# Patient Record
Sex: Female | Born: 1995 | Race: Black or African American | Hispanic: No | Marital: Single | Smoking: Never smoker
Health system: Southern US, Community
[De-identification: ages and names within clinical notes are randomized; demographics above are authoritative.]

## PROBLEM LIST (undated history)

## (undated) DIAGNOSIS — Z72820 Sleep deprivation: Secondary | ICD-10-CM

## (undated) DIAGNOSIS — R51 Headache: Secondary | ICD-10-CM

## (undated) DIAGNOSIS — N920 Excessive and frequent menstruation with regular cycle: Secondary | ICD-10-CM

## (undated) DIAGNOSIS — R519 Headache, unspecified: Secondary | ICD-10-CM

## (undated) HISTORY — DX: Headache: R51

## (undated) HISTORY — PX: BUNIONECTOMY: SHX129

## (undated) HISTORY — DX: Sleep deprivation: Z72.820

## (undated) HISTORY — DX: Excessive and frequent menstruation with regular cycle: N92.0

## (undated) HISTORY — DX: Headache, unspecified: R51.9

---
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
## 2017-03-07 ENCOUNTER — Ambulatory Visit: Admitting: Family Medicine

## 2017-03-10 ENCOUNTER — Encounter: Payer: Self-pay | Admitting: Family Medicine

## 2017-03-10 ENCOUNTER — Ambulatory Visit (INDEPENDENT_AMBULATORY_CARE_PROVIDER_SITE_OTHER): Admitting: Family Medicine

## 2017-03-10 ENCOUNTER — Other Ambulatory Visit: Payer: Self-pay

## 2017-03-10 VITALS — BP 102/62 | HR 72 | Temp 98.5°F | Ht 64.0 in | Wt 158.0 lb

## 2017-03-10 DIAGNOSIS — M2012 Hallux valgus (acquired), left foot: Secondary | ICD-10-CM | POA: Diagnosis not present

## 2017-03-10 NOTE — Progress Notes (Signed)
Subjective:     Patient ID: Kiara Mcfarland, female   DOB: 13-Nov-1995, 21 y.o.   MRN: [id number]  Foot Pain  Chronicity: left foot bump and pain for about 3 months. Pain is triggered by certain shoes. Episode onset: 3 months. The problem has been unchanged. Associated symptoms comments: At times she some redness in the area but mostly not swollen or red. Exacerbated by: Certain shoes. She has tried nothing for the symptoms.   Current Outpatient Medications on File Prior to Visit  Medication Sig Dispense Refill  . ibuprofen (ADVIL,MOTRIN) 400 MG tablet take 1 tablet by mouth every 8 hours if needed 90 tablet 1  . MONO-LINYAH 0.25-35 MG-MCG tablet take 1 tablet by mouth once daily 84 tablet 3   No current facility-administered medications on file prior to visit.    Past Medical History:  Diagnosis Date  . Headache   . Menorrhagia 01/09/2012  . Sleep deprivation 02/17/2014   Vitals:   03/10/17 0835  BP: 102/62  Pulse: 72  Temp: 98.5 F (36.9 C)  TempSrc: Oral  SpO2: 99%  Weight: 158 lb (71.7 kg)  Height: 5\' 4"  (1.626 m)     Review of Systems     Objective:   Physical Exam  Constitutional: She appears well-developed. No distress.  Cardiovascular: Normal rate, regular rhythm and normal heart sounds.  No murmur heard. Pulmonary/Chest: Effort normal and breath sounds normal. No respiratory distress. She has no wheezes.  Musculoskeletal:       Feet:  Nursing note and vitals reviewed.        Assessment:     Hallux Valgus    Plan:     Check problem list.

## 2017-03-10 NOTE — Assessment & Plan Note (Addendum)
Moderate bunion of her left foot with mild on the right. I discussed conservative measures vs surgical treatment. Mom concern this will worsen over time and will eventually need surgery. They both opted out for surgical treatment. I referred them to the podiatrist. Will defer xray to podiatrist. Ibuprofen as needed for pain. F/U as needed.

## 2017-03-10 NOTE — Patient Instructions (Signed)

## 2017-04-17 ENCOUNTER — Ambulatory Visit (INDEPENDENT_AMBULATORY_CARE_PROVIDER_SITE_OTHER): Admitting: Podiatry

## 2017-04-17 ENCOUNTER — Ambulatory Visit (INDEPENDENT_AMBULATORY_CARE_PROVIDER_SITE_OTHER)

## 2017-04-17 ENCOUNTER — Encounter: Payer: Self-pay | Admitting: Podiatry

## 2017-04-17 DIAGNOSIS — M2012 Hallux valgus (acquired), left foot: Secondary | ICD-10-CM

## 2017-04-17 NOTE — Progress Notes (Signed)
   Subjective:    Patient ID: Diona FoleyJakera M Aiken, female    DOB: Jun 07, 1995, 22 y.o.   MRN: [id number]  HPI    Review of Systems  All other systems reviewed and are negative.      Objective:   Physical Exam        Assessment & Plan:

## 2017-04-19 NOTE — Progress Notes (Signed)
   Subjective: 22 year old female presenting today as a new patient with a chief complaint of pain to the medial side of the left foot that began several months ago. She reports associated swelling of the area secondary to a bunion. She has not done anything to treat the symptoms. Walking or standing for long periods of time increase the pain. There are no alleviating factors noted. Patient is here for further evaluation and treatment.    Past Medical History:  Diagnosis Date  . Headache   . Menorrhagia 01/09/2012  . Sleep deprivation 02/17/2014      Objective: Physical Exam General: The patient is alert and oriented x3 in no acute distress.  Dermatology: Skin is cool, dry and supple bilateral lower extremities. Negative for open lesions or macerations.  Vascular: Palpable pedal pulses bilaterally. No edema or erythema noted. Capillary refill within normal limits.  Neurological: Epicritic and protective threshold grossly intact bilaterally.   Musculoskeletal Exam: Clinical evidence of bunion deformity noted to the respective foot. There is a moderate pain on palpation range of motion of the first MPJ. Lateral deviation of the hallux noted consistent with hallux abductovalgus.  Radiographic Exam: Increased intermetatarsal angle greater than 15 with a hallux abductus angle greater than 30 noted on AP view. Moderate degenerative changes noted within the first MPJ.  Assessment: 1. HAV w/ bunion deformity left foot   Plan of Care:  1. Patient was evaluated. X-Rays reviewed. 2. Today we discussed the conservative versus surgical management of the presenting pathology. The patient opts for surgical management. All possible complications and details of the procedure were explained. All patient questions were answered. No guarantees were expressed or implied. 3. Authorization for surgery was initiated today. Surgery will consist of lapidus bunionectomy left. 4. Return to clinic 1 week  post op.    Felecia ShellingBrent M. Leiby Pigeon, DPM [facility]  Dr. Felecia ShellingBrent M. Dezirea Mccollister, DPM    [address]                                        [place], [place] [id number]                Office [phone]  Fax [phone]

## 2017-04-20 ENCOUNTER — Telehealth: Payer: Self-pay | Admitting: *Deleted

## 2017-04-20 NOTE — Telephone Encounter (Signed)
"  My name is Kiara Mcfarland.  I was calling in regards to my daughter, Kiara Mcfarland.  I want to give you the date that hopefull is available for her to have her surgery.  Please give me a call back."

## 2017-04-25 NOTE — Telephone Encounter (Signed)
I attempted to return her call.  I left her a message to call me back. 

## 2017-04-26 ENCOUNTER — Telehealth: Payer: Self-pay | Admitting: Podiatry

## 2017-04-27 NOTE — Telephone Encounter (Signed)
Called and spoke to pt's mother Annice PihJackie. I told her we would get her daughter scheduled for surgery on 09 May and I would go ahead and contact the surgical center to let them know. I told her she could go ahead and pre-register with the surgical center or wait on them to call which ever was easiest for her. I also informed her that the surgical center would call them between 24 to 48 hours before her scheduled surgery date to let them know the time to arrive for surgery. Pt's mom asked if the surgery was scheduled for 09 May and I replied it was. I told her to call back with any questions or concerns between now and then. I also apologized for just now calling back.

## 2017-04-27 NOTE — Telephone Encounter (Signed)
This is Kiara Mcfarland and I'm calling for my daughter. I'm trying to get her surgery date set and we are preferring it to be performed on 03 Aug 2017. If you could please call me back at [phone]. Thank you.

## 2017-05-08 ENCOUNTER — Other Ambulatory Visit: Payer: Self-pay

## 2017-05-08 MED ORDER — NORGESTIMATE-ETH ESTRADIOL 0.25-35 MG-MCG PO TABS: 1.0000 | ORAL_TABLET | Freq: Every day | ORAL | 11 refills | Status: DC

## 2017-05-08 NOTE — Telephone Encounter (Signed)
Mother left message on nurse line asking for refill on birth control pills. Ples SpecterAlisa Brake, RN [facility] RN)

## 2017-05-21 ENCOUNTER — Ambulatory Visit (HOSPITAL_COMMUNITY): Admission: EM | Admit: 2017-05-21 | Discharge: 2017-05-21 | Discharge status: Against Medical Advice

## 2017-05-21 NOTE — ED Notes (Signed)
Called several times by pt access without answer

## 2017-05-23 ENCOUNTER — Other Ambulatory Visit: Payer: Self-pay

## 2017-05-23 ENCOUNTER — Ambulatory Visit (INDEPENDENT_AMBULATORY_CARE_PROVIDER_SITE_OTHER): Admitting: Internal Medicine

## 2017-05-23 ENCOUNTER — Encounter: Payer: Self-pay | Admitting: Internal Medicine

## 2017-05-23 VITALS — BP 110/70 | HR 60 | Temp 98.4°F | Wt 153.0 lb

## 2017-05-23 DIAGNOSIS — B373 Candidiasis of vulva and vagina: Secondary | ICD-10-CM

## 2017-05-23 DIAGNOSIS — B3731 Acute candidiasis of vulva and vagina: Secondary | ICD-10-CM

## 2017-05-23 LAB — POCT WET PREP (WET MOUNT)
CLUE CELLS WET PREP WHIFF POC: NEGATIVE
TRICHOMONAS WET PREP HPF POC: ABSENT

## 2017-05-23 MED ORDER — FLUCONAZOLE 150 MG PO TABS: ORAL_TABLET | ORAL | 0 refills | Status: DC

## 2017-05-23 NOTE — Patient Instructions (Addendum)
It was so nice to meet you!  You have a yeast infection. I have sent in a prescription for Diflucan. You should take 1 tablet now, which should clear up the yeast infection. If you are still having symptoms in 3 days, you can take the 2nd tablet.  -Dr. Nancy MarusMayo

## 2017-05-25 DIAGNOSIS — B3731 Acute candidiasis of vulva and vagina: Secondary | ICD-10-CM | POA: Insufficient documentation

## 2017-05-25 DIAGNOSIS — B373 Candidiasis of vulva and vagina: Secondary | ICD-10-CM | POA: Insufficient documentation

## 2017-05-25 NOTE — Progress Notes (Signed)
   [facility] Phone: [phone]  Subjective:  Kiara Mcfarland is a 22 year old female presenting to clinic with yellow/green discharge for the last couple of days. She is also having vaginal itching and irritation. She denies any vaginal lesions, pelvic pain, and dysuria. She is intermittently sexually active. She had STD testing a few weeks ago at college and everything was negative. She has not been sexually active since then. She uses OCPs for birth control. No recent antibiotics.  ROS: See HPI for pertinent positives and negatives  Past Medical History- migraines  Family history reviewed for today's visit. No changes.  Social history- patient is a never smoker  Objective: BP 110/70   Pulse 60   Temp 98.4 F (36.9 C) (Oral)   Wt 153 lb (69.4 kg)   LMP 05/23/2017   BMI 26.26 kg/m  Gen: NAD, alert, cooperative with exam GI: +BS, soft, non-tender, non-distended  Assessment/Plan: Vaginal Candidiasis: Wet prep with many yeast. Recent negative STD testing and has not been sexually active since then. - Treat with Diflucan 150mg  x 1, can take 2nd tablet in 3 days if symptoms persist   Willadean CarolKaty Shiah Berhow, MD PGY-3

## 2017-05-25 NOTE — Assessment & Plan Note (Signed)
Wet prep with many yeast. Recent negative STD testing and has not been sexually active since then. - Treat with Diflucan 150mg  x 1, can take 2nd tablet in 3 days if symptoms persist

## 2017-06-12 ENCOUNTER — Encounter (HOSPITAL_COMMUNITY): Payer: Self-pay | Admitting: Family Medicine

## 2017-06-12 ENCOUNTER — Ambulatory Visit (HOSPITAL_COMMUNITY)
Admission: EM | Admit: 2017-06-12 | Discharge: 2017-06-12 | Disposition: A | Discharge status: Home / Self Care | Attending: Internal Medicine | Admitting: Internal Medicine

## 2017-06-12 DIAGNOSIS — J029 Acute pharyngitis, unspecified: Secondary | ICD-10-CM | POA: Diagnosis not present

## 2017-06-12 DIAGNOSIS — J351 Hypertrophy of tonsils: Secondary | ICD-10-CM

## 2017-06-12 DIAGNOSIS — R590 Localized enlarged lymph nodes: Secondary | ICD-10-CM | POA: Diagnosis not present

## 2017-06-12 LAB — POCT RAPID STREP A: STREPTOCOCCUS, GROUP A SCREEN (DIRECT): NEGATIVE

## 2017-06-12 MED ORDER — CLINDAMYCIN HCL 300 MG PO CAPS: 300.0000 mg | ORAL_CAPSULE | Freq: Three times a day (TID) | ORAL | 0 refills | Status: DC

## 2017-06-12 NOTE — ED Triage Notes (Signed)
Pt here for 2 days of sore throat. Denies fever.

## 2017-06-12 NOTE — Discharge Instructions (Signed)
If symptoms do not improve and you develop fever, can't drink fluids, have worsening pain, difficulty breathing, do not wait more than 2 days after starting this antibiotic to come back. You may take 500mg  Tylenol with ibuprofen 600mg  every 6 hours for pain and inflammation.

## 2017-06-12 NOTE — ED Provider Notes (Signed)
  MRN: [id number] DOB: 1995/05/20  Subjective:   Kiara Mcfarland is a 22 y.o. female presenting for 2 day history of worsening sore throat, muffled voice, difficulty swallowing. Denies fever, sinus pain, congestion, ear pain, ear drainage, cough, chest pain, shob, n/v, abdominal pain, rashes. Denies smoking cigarettes.  No current facility-administered medications for this encounter.   Current Outpatient Medications:  .  clindamycin (CLEOCIN) 300 MG capsule, Take 1 capsule (300 mg total) by mouth 3 (three) times daily., Disp: 30 capsule, Rfl: 0 .  ibuprofen (ADVIL,MOTRIN) 400 MG tablet, take 1 tablet by mouth every 8 hours if needed, Disp: 90 tablet, Rfl: 1 .  norgestimate-ethinyl estradiol (MONO-LINYAH) 0.25-35 MG-MCG tablet, Take 1 tablet by mouth daily., Disp: 84 tablet, Rfl: 11   Kiara Mcfarland has No Known Allergies.  Kiara Mcfarland  has a past medical history of Headache, Menorrhagia (01/09/2012), and Sleep deprivation (02/17/2014). Also  has no past surgical history on file.  Objective:   Vitals: BP 130/71   Pulse (!) 102   Temp 99.2 F (37.3 C)   Resp 18   LMP 05/23/2017   SpO2 100%   Physical Exam  Constitutional: She is oriented to person, place, and time. She appears well-developed and well-nourished.  HENT:  Mouth/Throat: Oropharyngeal exudate (with tonsillar erythema and swelling) and posterior oropharyngeal edema present.    Cardiovascular: Normal rate, regular rhythm and intact distal pulses. Exam reveals no gallop and no friction rub.  No murmur heard. Pulmonary/Chest: No respiratory distress. She has no wheezes. She has no rales.  Lymphadenopathy:    She has cervical adenopathy (bilateral).  Neurological: She is alert and oriented to person, place, and time.   Results for orders placed or performed during the hospital encounter of 06/12/17 (from the past 24 hour(s))  POCT rapid strep A [facility])     Status: None   Collection Time: 06/12/17  2:03 PM  Result Value  Ref Range   Streptococcus, Group A Screen (Direct) NEGATIVE NEGATIVE   Assessment and Plan :   Acute pharyngitis, unspecified etiology  Sore throat  Swollen tonsil  Cervical lymphadenopathy  Will start clindamycin 300mg  TID. Counseled patient on potential for adverse effects with medications prescribed today, patient verbalized understanding. Return-to-clinic precautions discussed, patient verbalized understanding.    Wallis BambergMani, Tessia Kassin, [place] 06/12/17 2214

## 2017-06-15 LAB — CULTURE, GROUP A STREP (THRC)

## 2017-06-30 ENCOUNTER — Telehealth: Payer: Self-pay | Admitting: *Deleted

## 2017-06-30 NOTE — Telephone Encounter (Signed)
I don't have the paperwork for this patient yet.

## 2017-06-30 NOTE — Telephone Encounter (Signed)
"  I'm scheduled for surgery on May 5.  I want to reschedule it to May 16."  That date is available, I will get it rescheduled.  "What time?"  Someone from the surgical center will call you a day or two prior to your surgery date and they will give you your arrival time.  I called Aram BeechamCynthia at the surgical center and rescheduled the surgery from 08/03/2017 to 08/10/2017.

## 2017-07-04 ENCOUNTER — Telehealth: Payer: Self-pay | Admitting: *Deleted

## 2017-07-04 NOTE — Telephone Encounter (Signed)
I am returning your call.  Linford ArnoldJakera is rescheduled to May 16.  She did what she was supposed to do.  "I just wanted to make sure."  I have a question regarding [employer]akera's insurance, [employer].  Is that a retirement plan?  "It's not a retirement plan, her father is 100% disabled and she's a student so that's how she has it?  I'm glad you brought that up.  We've never had to use the insurance for anything like this.  How much will they cover and how much will we owe?"  I am not sure.  I'm going to call them tomorrow.  Once I get the information, I will let you know.

## 2017-07-04 NOTE — Telephone Encounter (Signed)
"  My name is Ansel BongJackie Ensey.  My daughter's name is Almyra DeforestJakera Odoherty.  She was scheduled for surgery on May 9 but we had to change it to May 19.  So I was just calling to get a confirm that she will have her surgery on the 16th instead of May 9.  Please give me a call back to confirm her surgery will be May 16."

## 2017-07-06 NOTE — Telephone Encounter (Signed)
Authorization was not required.  Insurance will cover 75%, the patient will be responsible for 25%.  I asked Chip BoerVicki to give her a call and to give her an estimate.

## 2017-07-13 ENCOUNTER — Telehealth: Payer: Self-pay | Admitting: *Deleted

## 2017-07-13 NOTE — Telephone Encounter (Signed)
I spoke with pt's mtr, Annice PihJackie and check if I could release the information to her. I did see 04/18/2017 form pt had signed for pt to release information to ChanceJackie. I told her surgeon was Dr. Gala LewandowskyBrent Evans and the surgery was 08/10/2017.

## 2017-07-13 NOTE — Telephone Encounter (Signed)
Pt's mtr, Kiara Mcfarland states she needs the name of pt's surgeon, for paperwork.

## 2017-07-18 NOTE — Patient Instructions (Signed)
Pre-Operative Instructions  Congratulations, you have decided to take an important step towards improving your quality of life.  You can be assured that the doctors and staff at [facility] will be with you every step of the way.  Here are some important things you should know:  1. Plan to be at the surgery center/hospital at least 1 (one) hour prior to your scheduled time, unless otherwise directed by the surgical center/hospital staff.  You must have a responsible adult accompany you, remain during the surgery and drive you home.  Make sure you have directions to the surgical center/hospital to ensure you arrive on time. 2. If you are having surgery at [facility] or [facility], you will need a copy of your medical history and physical form from your family physician within one month prior to the date of surgery. We will give you a form for your primary physician to complete.  3. We make every effort to accommodate the date you request for surgery.  However, there are times where surgery dates or times have to be moved.  We will contact you as soon as possible if a change in schedule is required.   4. No aspirin/ibuprofen for one week before surgery.  If you are on aspirin, any non-steroidal anti-inflammatory medications (Mobic, Aleve, Ibuprofen) should not be taken seven (7) days prior to your surgery.  You make take Tylenol for pain prior to surgery.  5. Medications - If you are taking daily heart and blood pressure medications, seizure, reflux, allergy, asthma, anxiety, pain or diabetes medications, make sure you notify the surgery center/hospital before the day of surgery so they can tell you which medications you should take or avoid the day of surgery. 6. No food or drink after midnight the night before surgery unless directed otherwise by surgical center/hospital staff. 7. No alcoholic beverages 24-hours prior to surgery.  No smoking 24-hours prior or 24-hours after  surgery. 8. Wear loose pants or shorts. They should be loose enough to fit over bandages, boots, and casts. 9. Don't wear slip-on shoes. Sneakers are preferred. 10. Bring your boot with you to the surgery center/hospital.  Also bring crutches or a walker if your physician has prescribed it for you.  If you do not have this equipment, it will be provided for you after surgery. 11. If you have not been contacted by the surgery center/hospital by the day before your surgery, call to confirm the date and time of your surgery. 12. Leave-time from work may vary depending on the type of surgery you have.  Appropriate arrangements should be made prior to surgery with your employer. 13. Prescriptions will be provided immediately following surgery by your doctor.  Fill these as soon as possible after surgery and take the medication as directed. Pain medications will not be refilled on weekends and must be approved by the doctor. 14. Remove nail polish on the operative foot and avoid getting pedicures prior to surgery. 15. Wash the night before surgery.  The night before surgery wash the foot and leg well with water and the antibacterial soap provided. Be sure to pay special attention to beneath the toenails and in between the toes.  Wash for at least three (3) minutes. Rinse thoroughly with water and dry well with a towel.  Perform this wash unless told not to do so by your physician.  Enclosed: 1 Ice pack (please put in freezer the night before surgery)   1 Hibiclens skin cleaner     Pre-op instructions  If you have any questions regarding the instructions, please do not hesitate to call our office.  [place]: [address], [place], [place] [address] -- [phone]  [place]: [address] [place], [place] [address] -- [phone]  : [address]  , [place] [address] -- [phone]  [place]: [address], [address], [place],  [address] -- [phone]  Website:  [email or web address] 

## 2017-07-26 HISTORY — PX: BUNIONECTOMY: SHX129

## 2017-08-02 ENCOUNTER — Encounter: Payer: Self-pay | Admitting: Internal Medicine

## 2017-08-02 ENCOUNTER — Other Ambulatory Visit: Payer: Self-pay

## 2017-08-02 ENCOUNTER — Ambulatory Visit (INDEPENDENT_AMBULATORY_CARE_PROVIDER_SITE_OTHER): Admitting: Internal Medicine

## 2017-08-02 DIAGNOSIS — Z3009 Encounter for other general counseling and advice on contraception: Secondary | ICD-10-CM | POA: Diagnosis not present

## 2017-08-02 DIAGNOSIS — Z01419 Encounter for gynecological examination (general) (routine) without abnormal findings: Secondary | ICD-10-CM | POA: Diagnosis not present

## 2017-08-02 NOTE — Assessment & Plan Note (Signed)
Patient currently on OCPs. Not having any issues. Discussed other options. Patient happy with current birth control.

## 2017-08-02 NOTE — Patient Instructions (Signed)
It was so nice to meet you!  Everything looks great today.  We will see you back in 1 year or earlier if needed.  -Dr. Nancy Marus

## 2017-08-02 NOTE — Progress Notes (Signed)
22 y.o. year old female presents for well woman/preventative visit and annual GYN examination.  Acute Concerns: none  Diet: tries to eat a lot of fruits, eats vegetables, eats meats, does eat some junk food  Exercise: not currently exercising, will restart after foot surgery  Birth Control: On OCPs, no issues  Social:  Social History   Socioeconomic History  . Marital status: Single    Spouse name: Not on file  . Number of children: Not on file  . Years of education: Not on file  . Highest education level: Not on file  Occupational History  . Not on file  Social Needs  . Financial resource strain: Not on file  . Food insecurity:    Worry: Not on file    Inability: Not on file  . Transportation needs:    Medical: Not on file    Non-medical: Not on file  Tobacco Use  . Smoking status: Passive Smoke Exposure - Never Smoker  . Smokeless tobacco: Never Used  Substance and Sexual Activity  . Alcohol use: No  . Drug use: No  . Sexual activity: Never  Lifestyle  . Physical activity:    Days per week: Not on file    Minutes per session: Not on file  . Stress: Not on file  Relationships  . Social connections:    Talks on phone: Not on file    Gets together: Not on file    Attends religious service: Not on file    Active member of club or organization: Not on file    Attends meetings of clubs or organizations: Not on file    Relationship status: Not on file  Other Topics Concern  . Not on file  Social History Narrative   Lives with mom and her brother (one year younger)    Immunization: Immunization History  Administered Date(s) Administered  . DTP 04/10/1998, 06/19/1998, 09/10/1998, 05/21/1999, 10/28/2002  . DTaP 04/10/1998, 06/19/1998, 09/10/1998, 05/21/1999, 10/28/2002  . HPV Quadrivalent 01/03/2011, 03/04/2011, 08/08/2011  . Hepatitis A 01/02/2007, 07/15/2008  . Hepatitis B 06/19/1998, 12/11/1998, 01/12/1999  . HiB (PRP-OMP) 04/10/1998, 06/19/1998, 09/10/1998,  07/15/1999  . IPV 04/10/1998, 06/19/1998, 01/12/1999, 10/28/2002  . MMR 01/12/1999, 05/01/2002  . Meningococcal Conjugate 09/22/2009  . Meningococcal Mcv4o 06/03/2016  . OPV 04/10/1998, 06/19/1998, 01/12/1999, 10/28/2002  . PPD Test 06/14/2010  . Pneumococcal Conjugate-13 05/21/1999, 07/15/1999  . Tdap 07/15/2008  . Varicella 01/12/1999, 01/02/2007    Cancer Screening:  Pap Smear: not indicated   Physical Exam: VITALS: Reviewed GEN: Pleasant female, NAD HEENT: Normocephalic, PERRL, EOMI, no scleral icterus, bilateral TM pearly grey, nasal septum midline, MMM, tonsillar enlargement, no anterior or posterior lymphadenopathy, no thyromegaly CARDIAC:RRR, S1 and S2 present, no murmur, no heaves/thrills RESP: CTAB, normal effort BREAST: Exam performed in the presence of a chaperone. No masses. No nipple discharge. No axillary lymphadenopathy. ABD: soft, no tenderness, normal bowel sounds EXT: No edema, 2+ radial and DP pulses SKIN: no rash  ASSESSMENT & PLAN: 22 y.o. female presents for annual well woman/preventative exam. Please see problem specific assessment and plan.   Birth Control Counseling: Patient currently on OCPs. Not having any issues. Discussed other options. Patient happy with current birth control.

## 2017-08-10 ENCOUNTER — Encounter: Payer: Self-pay | Admitting: Podiatry

## 2017-08-10 DIAGNOSIS — M2012 Hallux valgus (acquired), left foot: Secondary | ICD-10-CM | POA: Diagnosis not present

## 2017-08-11 ENCOUNTER — Telehealth: Payer: Self-pay | Admitting: Podiatry

## 2017-08-11 NOTE — Telephone Encounter (Signed)
Left message informing pt and her mtr I was going to go over the postop instructions, in case I was not able to speak with her today. I told pt she needed to remain in her surgical boot/shoe at all times even to sleep, that she should not be up on the foot or have it dangling or below her heart more than 15 minutes/hour, to apply ice in a light towel behind the knee to allow the cooled blood provide pain and swelling relief, that pt should take the pain medication as needed with a light meal or snack, and if able to tolerate ibuprofen take as the OTC package instructs, I told them to call again with concerns.

## 2017-08-11 NOTE — Telephone Encounter (Signed)
My daughter had surgery yesterday and I have some questions about the medication. If you would please call me back at [phone]. Thank you.

## 2017-08-11 NOTE — Telephone Encounter (Signed)
Left message to call for information concerning pt's surgery and medication.

## 2017-08-11 NOTE — Telephone Encounter (Signed)
I'm calling about my daughter who had surgery yesterday. The nurse called me but somehow I missed the call so I'm calling her back with questions about her medications. If someone could please call me back at [phone]. Thank you.

## 2017-08-11 NOTE — Telephone Encounter (Signed)
Left message informing pt's mtr I was returning her call concerning pt and recent surgery.

## 2017-08-16 ENCOUNTER — Ambulatory Visit (INDEPENDENT_AMBULATORY_CARE_PROVIDER_SITE_OTHER)

## 2017-08-16 ENCOUNTER — Ambulatory Visit (INDEPENDENT_AMBULATORY_CARE_PROVIDER_SITE_OTHER): Admitting: Podiatry

## 2017-08-16 ENCOUNTER — Encounter: Payer: Self-pay | Admitting: Podiatry

## 2017-08-16 VITALS — BP 110/68 | HR 101 | Temp 99.7°F | Resp 18

## 2017-08-16 DIAGNOSIS — Z9889 Other specified postprocedural states: Secondary | ICD-10-CM

## 2017-08-16 DIAGNOSIS — M2012 Hallux valgus (acquired), left foot: Secondary | ICD-10-CM | POA: Diagnosis not present

## 2017-08-19 NOTE — Progress Notes (Signed)
   Subjective:  Patient presents today status post lapidus bunionectomy of the left foot. DOS: 08/10/17. She states she is doing very well. She denies any pain. There are no new complaints noted. Patient is here for further evaluation and treatment.    Past Medical History:  Diagnosis Date  . Headache   . Menorrhagia 01/09/2012  . Sleep deprivation 02/17/2014      Objective/Physical Exam Neurovascular status intact.  Skin incisions appear to be well coapted with sutures and staples intact. No sign of infectious process noted. No dehiscence. No active bleeding noted. Moderate edema noted to the surgical extremity.  Radiographic Exam:  Orthopedic hardware and osteotomies sites appear to be stable with routine healing.  Assessment: 1. s/p lapidus bunionectomy left. DOS: 08/10/17   Plan of Care:  1. Patient was evaluated. X-rays reviewed 2. Dressing changed. Keep clean, dry and intact for one week.  3. Continue nonweightbearing in CAM boot with crutches.  4. Return to clinic in one week.    Felecia Shelling, DPM [facility]  Dr. Felecia Shelling, DPM    [address]                                        [place], [place] [address]                Office [phone]  Fax [phone]

## 2017-08-23 ENCOUNTER — Ambulatory Visit (INDEPENDENT_AMBULATORY_CARE_PROVIDER_SITE_OTHER): Admitting: Podiatry

## 2017-08-23 DIAGNOSIS — M2012 Hallux valgus (acquired), left foot: Secondary | ICD-10-CM

## 2017-08-23 DIAGNOSIS — Z9889 Other specified postprocedural states: Secondary | ICD-10-CM

## 2017-08-27 NOTE — Progress Notes (Signed)
   Subjective:  Patient presents today status post lapidus bunionectomy of the left foot. DOS: 08/10/17. She states she is doing very well. She denies any complaints at this time. Patient is here for further evaluation and treatment.    Past Medical History:  Diagnosis Date  . Headache   . Menorrhagia 01/09/2012  . Sleep deprivation 02/17/2014      Objective/Physical Exam Neurovascular status intact.  Skin incisions appear to be well coapted with sutures and staples intact. No sign of infectious process noted. No dehiscence. No active bleeding noted. Moderate edema noted to the surgical extremity.  Assessment: 1. s/p lapidus bunionectomy left. DOS: 08/10/17   Plan of Care:  1. Patient was evaluated.  2. Sutures removed.  3. Continue nonweightbearing in CAM boot for 2 weeks.  4. Return to clinic in 2 weeks for follow up X-Rays.    Felecia ShellingBrent M. Stephen Turnbaugh, DPM [facility]  Dr. Felecia ShellingBrent M. Mahkai Fangman, DPM    [address]                                        [place], [place] [address]                Office [phone]  Fax [phone]

## 2017-09-06 ENCOUNTER — Encounter: Payer: Self-pay | Admitting: Podiatry

## 2017-09-06 ENCOUNTER — Ambulatory Visit (INDEPENDENT_AMBULATORY_CARE_PROVIDER_SITE_OTHER)

## 2017-09-06 ENCOUNTER — Ambulatory Visit (INDEPENDENT_AMBULATORY_CARE_PROVIDER_SITE_OTHER): Admitting: Podiatry

## 2017-09-06 DIAGNOSIS — M2012 Hallux valgus (acquired), left foot: Secondary | ICD-10-CM | POA: Diagnosis not present

## 2017-09-06 DIAGNOSIS — Z9889 Other specified postprocedural states: Secondary | ICD-10-CM

## 2017-09-08 NOTE — Progress Notes (Signed)
DOS 08/10/2017  Lapidus bunionectomy left foot.

## 2017-09-16 NOTE — Progress Notes (Signed)
   Subjective:  Patient presents today status post lapidus bunionectomy of the left foot. DOS: 08/10/17. She states she is doing well. She denies any pain or any other complaints at this time. She has been wearing the CAM boot as directed. Patient is here for further evaluation and treatment.    Past Medical History:  Diagnosis Date  . Headache   . Menorrhagia 01/09/2012  . Sleep deprivation 02/17/2014      Objective/Physical Exam Neurovascular status intact.  Skin incisions appear to be well coapted. No sign of infectious process noted. No dehiscence. No active bleeding noted. Moderate edema noted to the surgical extremity.  Radiographic Exam:  Orthopedic hardware and osteotomies sites appear to be stable with routine healing.  Assessment: 1. s/p lapidus bunionectomy left. DOS: 08/10/17   Plan of Care:  1. Patient was evaluated. X-Rays reviewed.  2. Orders for physical therapy placed.  3. Post op shoe dispensed. Weightbearing as tolerated. Discontinue using CAM boot.  4. Return to clinic in 4 weeks.    Felecia ShellingBrent M. Chevella Pearce, DPM [facility]  Dr. Felecia ShellingBrent M. Trypp Heckmann, DPM    [address]                                        [place], [place] [address]                Office [phone]  Fax [phone](336) [phone]

## 2017-10-04 ENCOUNTER — Encounter: Payer: Self-pay | Admitting: Podiatry

## 2017-10-04 ENCOUNTER — Ambulatory Visit (INDEPENDENT_AMBULATORY_CARE_PROVIDER_SITE_OTHER): Admitting: Podiatry

## 2017-10-04 ENCOUNTER — Ambulatory Visit (INDEPENDENT_AMBULATORY_CARE_PROVIDER_SITE_OTHER)

## 2017-10-04 DIAGNOSIS — M2012 Hallux valgus (acquired), left foot: Secondary | ICD-10-CM

## 2017-10-04 DIAGNOSIS — Z9889 Other specified postprocedural states: Secondary | ICD-10-CM

## 2017-10-08 NOTE — Progress Notes (Signed)
   Subjective:  Patient presents today status post lapidus bunionectomy of the left foot. DOS: 08/10/17. She states she is doing great. She denies any pain or complaints at this time. Patient is here for further evaluation and treatment.    Past Medical History:  Diagnosis Date  . Headache   . Menorrhagia 01/09/2012  . Sleep deprivation 02/17/2014      Objective/Physical Exam Neurovascular status intact.  Skin incisions appear to be well coapted. No sign of infectious process noted. No dehiscence. No active bleeding noted. Moderate edema noted to the surgical extremity.  Radiographic Exam:  Orthopedic hardware and osteotomies sites appear to be stable with routine healing.  Assessment: 1. s/p lapidus bunionectomy left. DOS: 08/10/17   Plan of Care:  1. Patient was evaluated. X-Rays reviewed.  2. May resume full activity with no restrictions.  3. May resume wearing normal shoe gear.  4. Return to clinic as needed.     Felecia ShellingBrent M. Lavern Maslow, DPM [facility]  Dr. Felecia ShellingBrent M. Tracina Beaumont, DPM    [address]                                        [place], [place] [address]                Office [phone]  Fax [phone](336) [phone]

## 2017-11-30 ENCOUNTER — Encounter (HOSPITAL_COMMUNITY): Payer: Self-pay | Admitting: Emergency Medicine

## 2017-11-30 ENCOUNTER — Ambulatory Visit (HOSPITAL_COMMUNITY)
Admission: EM | Admit: 2017-11-30 | Discharge: 2017-11-30 | Disposition: A | Discharge status: Home / Self Care | Attending: Family Medicine | Admitting: Family Medicine

## 2017-11-30 DIAGNOSIS — K625 Hemorrhage of anus and rectum: Secondary | ICD-10-CM | POA: Diagnosis not present

## 2017-11-30 LAB — POCT I-STAT, CHEM 8
BUN: 10 mg/dL (ref 6–20)
CALCIUM ION: 1.2 mmol/L (ref 1.15–1.40)
Chloride: 103 mmol/L (ref 98–111)
Creatinine, Ser: 0.7 mg/dL (ref 0.44–1.00)
GLUCOSE: 80 mg/dL (ref 70–99)
HCT: 36 % (ref 36.0–46.0)
Hemoglobin: 12.2 g/dL (ref 12.0–15.0)
Potassium: 3.8 mmol/L (ref 3.5–5.1)
SODIUM: 139 mmol/L (ref 135–145)
TCO2: 24 mmol/L (ref 22–32)

## 2017-11-30 MED ORDER — HYDROCORTISONE ACETATE 25 MG RE SUPP: 25.0000 mg | Freq: Two times a day (BID) | RECTAL | 0 refills | Status: DC

## 2017-11-30 NOTE — ED Triage Notes (Signed)
Pt states this morning she woke up and had a BM and "there was blood in my stool like I was on my period". Pt states she had a second bowel movement with blood in it as well.

## 2017-11-30 NOTE — Discharge Instructions (Addendum)
Exam is reassuring today, as is your blood test.  I question if you have a small break in the rectal skin tissue (Fissue) vs an internal hemorrhoid which is causing this bleeding.  I would recommend a bland diet until symptoms improve, primarily liquid diet.  May use the suppositories as needed for burning, may also help with bleeding.  If develop worsening of abdominal pain, fevers, diarrhea, increased blood or constant bleeding, weakness, dizziness, or otherwise worsening please return or go to the Er.  If persistent without worsening or improvement please follow up with GI and/or your PCP

## 2017-11-30 NOTE — ED Provider Notes (Signed)
[facility]    CSN: [id number] Arrival date & time: 11/30/17  1604     History   Chief Complaint Chief Complaint  Patient presents with  . Blood In Stools    HPI SIRENITY SHEW is a 22 y.o. female.   Rily presents with complaints of episodes of abdominal pain which work her at approximately 0300 this morning. Then when she woke this morning had some pain followed by episode of stool, which was formed, which was also with bright red blood "like I was on my period." noted some burning to the rectum. At 1400 this afternoon she had an episode of bright red blood with stool as well, stool was more loose at that time however. Had some abdominal pain following this. Felt some nausea last night and earlier this morning. No further today. No longer with abdominal pain. No fevers. Has been eating and drinking today. After stool has some rectal burning but otherwise no rectal pain. No vaginal or urinary complaints. States typically she has a BM daily, denies constipation or frequent diarrhea. No known ill contacts. She at [employer] and hot cheetos last night. She takes birth control daily. Only occasional ibuprofen use, last was probably a week ago. Hx of headache, menorrhagia.     ROS per HPI.      Past Medical History:  Diagnosis Date  . Headache   . Menorrhagia 01/09/2012  . Sleep deprivation 02/17/2014    Patient Active Problem List   Diagnosis Date Noted  . Hallux valgus of left foot 03/10/2017  . Migraine without aura and without status migrainosus, not intractable 02/17/2014  . Episodic tension type headache 02/17/2014  . Birth control counseling 11/29/2012    History reviewed. No pertinent surgical history.  OB History   None      Home Medications    Prior to Admission medications   Medication Sig Start Date End Date Taking? Authorizing Provider  hydrocortisone (ANUSOL-HC) 25 MG suppository Place 1 suppository (25 mg total) rectally 2 (two) times  daily. 11/30/17   Georgetta Haber, NP  ibuprofen (ADVIL,MOTRIN) 400 MG tablet take 1 tablet by mouth every 8 hours if needed 12/08/16   Doreene Eland, MD  norgestimate-ethinyl estradiol (MONO-LINYAH) 0.25-35 MG-MCG tablet Take 1 tablet by mouth daily. 05/08/17   Doreene Eland, MD    Family History Family History  Problem Relation Age of Onset  . Diabetes Maternal Grandmother   . Hypertension Maternal Grandmother   . Stroke Maternal Grandmother        Died at 44  . Diabetes Maternal Grandfather        Died at 28  . Hypertension Maternal Grandfather   . Migraines Father     Social History Social History   Tobacco Use  . Smoking status: Passive Smoke Exposure - Never Smoker  . Smokeless tobacco: Never Used  Substance Use Topics  . Alcohol use: No  . Drug use: No     Allergies   Nickel   Review of Systems Review of Systems   Physical Exam Triage Vital Signs ED Triage Vitals [11/30/17 1624]  Enc Vitals Group     BP 122/70     Pulse Rate 63     Resp 16     Temp 98.1 F (36.7 C)     Temp src      SpO2 100 %     Weight      Height      Head Circumference  Peak Flow      Pain Score      Pain Loc      Pain Edu?      Excl. in GC?    No data found.  Updated Vital Signs BP 122/70   Pulse 63   Temp 98.1 F (36.7 C)   Resp 16   LMP 11/25/2017   SpO2 100%    Physical Exam  Constitutional: She is oriented to person, place, and time. She appears well-developed and well-nourished. No distress.  Cardiovascular: Normal rate, regular rhythm and normal heart sounds.  Pulmonary/Chest: Effort normal and breath sounds normal.  Abdominal: Soft. Bowel sounds are increased. There is no hepatosplenomegaly. There is tenderness in the right lower quadrant and left lower quadrant. There is no rigidity, no rebound, no guarding, no CVA tenderness, no tenderness at McBurney's point and negative Murphy's sign.  Mild RLQ and LLQ tenderness on palpation  Genitourinary:  Rectum normal.  Genitourinary Comments: No obvious hemorrhoids or frank blood on DRE; no visible fissue; no active bleeding   Neurological: She is alert and oriented to person, place, and time.  Skin: Skin is warm and dry.     UC Treatments / Results  Labs (all labs ordered are listed, but only abnormal results are displayed) Labs Reviewed - No data to display  EKG None  Radiology No results found.  Procedures Procedures (including critical care time)  Medications Ordered in UC Medications - No data to display  Initial Impression / Assessment and Plan / UC Course  I have reviewed the triage vital signs and the nursing notes.  Pertinent labs & imaging results that were available during my care of the patient were reviewed by me and considered in my medical decision making (see chart for details).     Non toxic, afebrile. Physical exam reassuring without red flag findings. Question gastroenteritis and fissure vs internal hemorrhoid potential causing rectal bleeding. anusol suppositories provided. Liquid diet to advance to bland as tolerated until symptoms improve. Return precautions provided. Follow up with GI and/or PCP as needed. Patient verbalized understanding and agreeable to plan.  Ambulatory out of clinic without difficulty.   Final Clinical Impressions(s) / UC Diagnoses   Final diagnoses:  Rectal bleeding     Discharge Instructions     Exam is reassuring today, as is your blood test.  I question if you have a small break in the rectal skin tissue (Fissue) vs an internal hemorrhoid which is causing this bleeding.  I would recommend a bland diet until symptoms improve, primarily liquid diet.  May use the suppositories as needed for burning, may also help with bleeding.  If develop worsening of abdominal pain, fevers, diarrhea, increased blood or constant bleeding, weakness, dizziness, or otherwise worsening please return or go to the Er.  If persistent without  worsening or improvement please follow up with GI and/or your PCP    ED Prescriptions    Medication Sig Dispense Auth. Provider   hydrocortisone (ANUSOL-HC) 25 MG suppository Place 1 suppository (25 mg total) rectally 2 (two) times daily. 12 suppository Georgetta Haber, NP     Controlled Substance Prescriptions [place] Controlled Substance Registry consulted? Not Applicable   Georgetta Haber, NP 11/30/17 1729

## 2017-12-01 ENCOUNTER — Ambulatory Visit

## 2017-12-01 ENCOUNTER — Telehealth: Payer: Self-pay

## 2017-12-01 ENCOUNTER — Other Ambulatory Visit: Payer: Self-pay | Admitting: Family Medicine

## 2017-12-01 MED ORDER — HYDROCORTISONE ACETATE 30 MG RE SUPP: 1.0000 | Freq: Every day | RECTAL | 0 refills | Status: DC

## 2017-12-01 NOTE — Telephone Encounter (Signed)
Patient's mother called. Patient prescribed suppository at [facility] yesterday which is not covered by her insurance and is over $100. Asks if PCP can prescribe something similar that is less expensive.  Call back is [phone]  Ples Specter, RN [facility] RN)

## 2017-12-01 NOTE — Telephone Encounter (Signed)
Rx completed

## 2017-12-01 NOTE — Telephone Encounter (Signed)
Spoke with mother. The suppository is Anusol. She asks that you send in the Proctocort to [facility].  Ples Specter, RN [facility] RN)

## 2017-12-01 NOTE — Telephone Encounter (Signed)
It will be nice to know the name of the medication she is talking about.  I guess it is Anusol. Please let patient know that I can send in proctocort, however, I am uncertain if insurance will cover that either or if it will be cheaper. I will go ahead with refill if she want.  Please confirm the name of medication they are talking about for clarification purpose.

## 2018-05-24 ENCOUNTER — Ambulatory Visit (INDEPENDENT_AMBULATORY_CARE_PROVIDER_SITE_OTHER): Admitting: Family Medicine

## 2018-05-24 ENCOUNTER — Other Ambulatory Visit: Payer: Self-pay

## 2018-05-24 ENCOUNTER — Encounter: Payer: Self-pay | Admitting: Family Medicine

## 2018-05-24 VITALS — BP 110/62 | HR 70 | Temp 99.6°F | Wt 156.0 lb

## 2018-05-24 DIAGNOSIS — H6193 Disorder of external ear, unspecified, bilateral: Secondary | ICD-10-CM

## 2018-05-24 HISTORY — DX: Disorder of external ear, unspecified, bilateral: H61.93

## 2018-05-24 NOTE — Patient Instructions (Signed)
It was great meeting you today!  I am sorry you have been having ear pain.  I think this is likely due to something called a granuloma which was formed where ear have been pierced.  Granuloma is fibrous reactive tissue which forms due to a trauma of the ear.  For whatever reason the location that both second piercings of your ear is predisposed that tissue deforming and is likely causing the pain.  Even all the right stuff so far which includes try different jewelry, giving it time to rest, among other things.  You can try some Tylenol to see if this helps the pain some.  Otherwise can try and rest continue to rest that area.  If none of these work then unfortunately the best solution is just to stop using the second piercing site in both ears.  Please [place] know if you have any issues.

## 2018-05-24 NOTE — Assessment & Plan Note (Signed)
Exam and history consistent with granuloma formation at second your patient site.  Asked her why the site is painful compared to the other piercing sites, this remains a bit of a mystery.  Patient has already done all the right stuff, has switched jewelry to different metal types, and given both areas a chance to heal.  Patient can try allowing area to heal 1 more time, 2 to 3 weeks.  This does not help, then advised patient to simply not use the piercing sites anymore.

## 2018-05-24 NOTE — Progress Notes (Signed)
   HPI 23 year old African-American female who presents for bilateral ear pain.  Patient states that whenever she uses an earring, her second piercing site on both ears causes her an aching sharp pain.  She states that this pain is been present since she got them pierced when she was 13.  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  Patient states that she has intermittently noticed some clear fluid come out of both ear holes.  States that the fluid has only been clear and is not smelled.  She states that she had both of her second ear holes placed at [facility].  She uses the same jewelry cleaner for all jewelry that she has owns.  States that she previously gave both your hold around a month to "rest up".  This did not help at all.  They both started hurting at the same time, and she does not believe there is a chance she has a retained foreign body from her earring in her ear.  CC: Bilateral earlobe pain.   ROS:   Review of Systems See HPI for ROS.   CC, SH/smoking status, and VS noted  Objective: BP 110/62   Pulse 70   Temp 99.6 F (37.6 C) (Oral)   Wt 156 lb (70.8 kg)   SpO2 99%   BMI 25.96 kg/m  Gen: Very pleasant 23 year old African-American female, resting comfortably, no acute distress HEENT: Inspected all 5 piercing site.  Affected sites approximately 3 cm above initial piercing sites.  Very small amount of swelling, and nodular texture at both of these sites.  No erythema, no pus, no retained foreign body can be inspected. 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  Asked her why the site is painful compared to the other piercing sites, this remains a bit of a mystery.  Patient has  already done all the right stuff, has switched jewelry to different metal types, and given both areas a chance to heal.  Patient can try allowing area to heal 1 more time, 2 to 3 weeks.  This does not help, then advised patient to simply not use the piercing sites anymore.   No orders of the defined types were placed in this encounter.   No orders of the defined types were placed in this encounter.   Myrene Buddy MD PGY-2 Family Medicine Resident  05/24/2018 4:31 PM

## 2018-06-15 ENCOUNTER — Other Ambulatory Visit: Payer: Self-pay | Admitting: Family Medicine

## 2018-07-25 ENCOUNTER — Telehealth: Payer: Self-pay

## 2018-07-25 NOTE — Telephone Encounter (Signed)
LVM for patient to call and schedule and appointment for a physical per Dr. Lum Babe.  Glennie Hawk, CMA

## 2018-08-21 ENCOUNTER — Encounter: Admitting: Family Medicine

## 2018-08-27 ENCOUNTER — Ambulatory Visit (INDEPENDENT_AMBULATORY_CARE_PROVIDER_SITE_OTHER): Admitting: Podiatry

## 2018-08-27 ENCOUNTER — Ambulatory Visit (INDEPENDENT_AMBULATORY_CARE_PROVIDER_SITE_OTHER)

## 2018-08-27 ENCOUNTER — Other Ambulatory Visit: Payer: Self-pay

## 2018-08-27 ENCOUNTER — Encounter: Payer: Self-pay | Admitting: Podiatry

## 2018-08-27 VITALS — Temp 97.9°F

## 2018-08-27 DIAGNOSIS — M2012 Hallux valgus (acquired), left foot: Secondary | ICD-10-CM

## 2018-08-27 DIAGNOSIS — M778 Other enthesopathies, not elsewhere classified: Secondary | ICD-10-CM

## 2018-08-27 DIAGNOSIS — Q6650 Congenital pes planus, unspecified foot: Secondary | ICD-10-CM

## 2018-08-27 DIAGNOSIS — M779 Enthesopathy, unspecified: Secondary | ICD-10-CM | POA: Diagnosis not present

## 2018-08-28 ENCOUNTER — Ambulatory Visit (INDEPENDENT_AMBULATORY_CARE_PROVIDER_SITE_OTHER): Admitting: Family Medicine

## 2018-08-28 ENCOUNTER — Other Ambulatory Visit: Payer: Self-pay

## 2018-08-28 ENCOUNTER — Encounter: Payer: Self-pay | Admitting: Family Medicine

## 2018-08-28 VITALS — BP 100/60 | HR 70 | Ht 65.0 in | Wt 143.4 lb

## 2018-08-28 DIAGNOSIS — R634 Abnormal weight loss: Secondary | ICD-10-CM | POA: Diagnosis not present

## 2018-08-28 DIAGNOSIS — Z23 Encounter for immunization: Secondary | ICD-10-CM | POA: Diagnosis not present

## 2018-08-28 DIAGNOSIS — R1110 Vomiting, unspecified: Secondary | ICD-10-CM | POA: Diagnosis not present

## 2018-08-28 DIAGNOSIS — Z0000 Encounter for general adult medical examination without abnormal findings: Secondary | ICD-10-CM

## 2018-08-28 NOTE — Patient Instructions (Signed)
Tdap Vaccine (Tetanus, Diphtheria and Pertussis): What You Need to Know  1. Why get vaccinated?  Tetanus, diphtheria and pertussis are very serious diseases. Tdap vaccine can protect us from these diseases. And, Tdap vaccine given to pregnant women can protect newborn babies against pertussis..  TETANUS (Lockjaw) is rare in the [place] today. It causes painful muscle tightening and stiffness, usually all over the body.  · It can lead to tightening of muscles in the head and neck so you can't open your mouth, swallow, or sometimes even breathe. Tetanus kills about 1 out of 10 people who are infected even after receiving the best medical care.  DIPHTHERIA is also rare in the [place] today. It can cause a thick coating to form in the back of the throat.  · It can lead to breathing problems, heart failure, paralysis, and death.  PERTUSSIS (Whooping Cough) causes severe coughing spells, which can cause difficulty breathing, vomiting and disturbed sleep.  · It can also lead to weight loss, incontinence, and rib fractures. Up to 2 in 100 adolescents and 5 in 100 adults with pertussis are hospitalized or have complications, which could include pneumonia or death.  These diseases are caused by bacteria. Diphtheria and pertussis are spread from person to person through secretions from coughing or sneezing. Tetanus enters the body through cuts, scratches, or wounds.  Before vaccines, as many as 200,000 cases of diphtheria, 200,000 cases of pertussis, and hundreds of cases of tetanus, were reported in the [place] each year. Since vaccination began, reports of cases for tetanus and diphtheria have dropped by about 99% and for pertussis by about 80%.  2. Tdap vaccine  Tdap vaccine can protect adolescents and adults from tetanus, diphtheria, and pertussis. One dose of Tdap is routinely given at age 11 or 12. People who did not get Tdap at that age should get it as soon as possible.  Tdap is especially important  for healthcare professionals and anyone having close contact with a baby younger than 12 months.  Pregnant women should get a dose of Tdap during every pregnancy, to protect the newborn from pertussis. Infants are most at risk for severe, life-threatening complications from pertussis.  Another vaccine, called Td, protects against tetanus and diphtheria, but not pertussis. A Td booster should be given every 10 years. Tdap may be given as one of these boosters if you have never gotten Tdap before. Tdap may also be given after a severe cut or burn to prevent tetanus infection.  Your doctor or the person giving you the vaccine can give you more information.  Tdap may safely be given at the same time as other vaccines.  3. Some people should not get this vaccine  · A person who has ever had a life-threatening allergic reaction after a previous dose of any diphtheria, tetanus or pertussis containing vaccine, OR has a severe allergy to any part of this vaccine, should not get Tdap vaccine. Tell the person giving the vaccine about any severe allergies.  · Anyone who had coma or long repeated seizures within 7 days after a childhood dose of DTP or DTaP, or a previous dose of Tdap, should not get Tdap, unless a cause other than the vaccine was found. They can still get Td.  · Talk to your doctor if you:  ? have seizures or another nervous system problem,  ? had severe pain or swelling after any vaccine containing diphtheria, tetanus or pertussis,  ? ever had a condition   called Guillain-Barré Syndrome (GBS),  ? aren't feeling well on the day the shot is scheduled.  4. Risks  With any medicine, including vaccines, there is a chance of side effects. These are usually mild and go away on their own. Serious reactions are also possible but are rare.  Most people who get Tdap vaccine do not have any problems with it.  Mild problems following Tdap  (Did not interfere with activities)  · Pain where the shot was given (about 3 in 4  adolescents or 2 in 3 adults)  · Redness or swelling where the shot was given (about 1 person in 5)  · Mild fever of at least 100.4°F (up to about 1 in 25 adolescents or 1 in 100 adults)  · Headache (about 3 or 4 people in 10)  · Tiredness (about 1 person in 3 or 4)  · Nausea, vomiting, diarrhea, stomach ache (up to 1 in 4 adolescents or 1 in 10 adults)  · Chills, sore joints (about 1 person in 10)  · Body aches (about 1 person in 3 or 4)  · Rash, swollen glands (uncommon)  Moderate problems following Tdap  (Interfered with activities, but did not require medical attention)  · Pain where the shot was given (up to 1 in 5 or 6)  · Redness or swelling where the shot was given (up to about 1 in 16 adolescents or 1 in 12 adults)  · Fever over 102°F (about 1 in 100 adolescents or 1 in 250 adults)  · Headache (about 1 in 7 adolescents or 1 in 10 adults)  · Nausea, vomiting, diarrhea, stomach ache (up to 1 or 3 people in 100)  · Swelling of the entire arm where the shot was given (up to about 1 in 500).  Severe problems following Tdap  (Unable to perform usual activities; required medical attention)  · Swelling, severe pain, bleeding and redness in the arm where the shot was given (rare).  Problems that could happen after any vaccine:  · People sometimes faint after a medical procedure, including vaccination. Sitting or lying down for about 15 minutes can help prevent fainting, and injuries caused by a fall. Tell your doctor if you feel dizzy, or have vision changes or ringing in the ears.  · Some people get severe pain in the shoulder and have difficulty moving the arm where a shot was given. This happens very rarely.  · Any medication can cause a severe allergic reaction. Such reactions from a vaccine are very rare, estimated at fewer than 1 in a million doses, and would happen within a few minutes to a few hours after the vaccination.  As with any medicine, there is a very remote chance of a vaccine causing a serious  injury or death.  The safety of vaccines is always being monitored. For more information, visit: www.cdc.gov/vaccinesafety/  5. What if there is a serious problem?  What should I look for?  · Look for anything that concerns you, such as signs of a severe allergic reaction, very high fever, or unusual behavior.  Signs of a severe allergic reaction can include hives, swelling of the face and throat, difficulty breathing, a fast heartbeat, dizziness, and weakness. These would usually start a few minutes to a few hours after the vaccination.  What should I do?  · If you think it is a severe allergic reaction or other emergency that can't wait, call 9-1-1 or get the person to the nearest hospital. Otherwise,   call your doctor.  · Afterward, the reaction should be reported to the Vaccine Adverse Event Reporting System (VAERS). Your doctor might file this report, or you can do it yourself through the VAERS web site at www.vaers.hhs.gov, or by calling 1-800-822-7967.  VAERS does not give medical advice.  6. The National Vaccine Injury Compensation Program  The National Vaccine Injury Compensation Program (VICP) is a federal program that was created to compensate people who may have been injured by certain vaccines.  Persons who believe they may have been injured by a vaccine can learn about the program and about filing a claim by calling 1-800-338-2382 or visiting the VICP website at www.hrsa.gov/vaccinecompensation. There is a time limit to file a claim for compensation.  7. How can I learn more?  · Ask your doctor. He or she can give you the vaccine package insert or suggest other sources of information.  · Call your local or state health department.  · Contact the Centers for Disease Control and Prevention (CDC):  ? Call 1-800-232-4636 (1-800-CDC-INFO) or  ? Visit CDC's website at www.cdc.gov/vaccines  Vaccine Information Statement Tdap Vaccine (05/21/2013)  This information is not intended to replace advice given to you  by your health care provider. Make sure you discuss any questions you have with your health care provider.  Document Released: 09/13/2011 Document Revised: 10/30/2017 Document Reviewed: 10/30/2017  Elsevier Interactive Patient Education © 2019 Elsevier Inc.

## 2018-08-28 NOTE — Progress Notes (Signed)
Patient ID: Kiara Mcfarland, female   DOB: February 03, 1996, 23 y.o.   MRN: [id number]   NB: Mom present during visit. She said it's ok for her mom to be present and discuss her health issues. Subjective:     Kiara Mcfarland is a 23 y.o. female and is here for a comprehensive physical exam. The patient reports problems - C/O nausea and vomiting with dizziness whenever she wakes up in the morning. Onging for few months now. Last episodes was 2 weeks ago. Feels well today. No known trigger. She feels well after drinking ginger ale and she feels well throughout the day. Usually occurs once a month. LMP: 1 weeks ago. Denies ever being sexually active. Per mom, she is trying to loss weight.  Social History   Socioeconomic History  . Marital status: Single    Spouse name: Not on file  . Number of children: Not on file  . Years of education: Not on file  . Highest education level: Not on file  Occupational History  . Not on file  Social Needs  . Financial resource strain: Not on file  . Food insecurity:    Worry: Not on file    Inability: Not on file  . Transportation needs:    Medical: Not on file    Non-medical: Not on file  Tobacco Use  . Smoking status: Passive Smoke Exposure - Never Smoker  . Smokeless tobacco: Never Used  Substance and Sexual Activity  . Alcohol use: No  . Drug use: No  . Sexual activity: Never  Lifestyle  . Physical activity:    Days per week: Not on file    Minutes per session: Not on file  . Stress: Not on file  Relationships  . Social connections:    Talks on phone: Not on file    Gets together: Not on file    Attends religious service: Not on file    Active member of club or organization: Not on file    Attends meetings of clubs or organizations: Not on file    Relationship status: Not on file  . Intimate partner violence:    Fear of current or ex partner: Not on file    Emotionally abused: Not on file    Physically abused: Not on file    Forced  sexual activity: Not on file  Other Topics Concern  . Not on file  Social History Narrative   Lives with mom and her brother (one year younger)   Health Maintenance  Topic Date Due  . Janet BerlinETANUS/TDAP  07/16/2018  . INFLUENZA VACCINE  10/27/2018  . HIV Screening  Completed    The following portions of the patient's history were reviewed and updated as appropriate: allergies, current medications, past family history, past medical history, past social history, past surgical history and problem list.  Review of Systems Pertinent items noted in HPI and remainder of comprehensive ROS otherwise negative.   Objective:    BP 100/60   Pulse 70   Ht 5\' 5"  (1.651 m)   Wt 143 lb 6 oz (65 kg)   LMP 08/21/2018   SpO2 98%   BMI 23.86 kg/m  General appearance: alert, cooperative and appears stated age Head: Normocephalic, without obvious abnormality, atraumatic Eyes: conjunctivae/corneas clear. PERRL, EOM's intact. Fundi benign. Ears: normal TM's and external ear canals both ears Throat: lips, mucosa, and tongue normal; teeth and gums normal Lungs: clear to auscultation bilaterally Heart: regular rate and rhythm, S1, S2 normal,  no murmur, click, rub or gallop Abdomen: soft, non-tender; bowel sounds normal; no masses,  no organomegaly Extremities: extremities normal, atraumatic, no cyanosis or edema Skin: Skin color, texture, turgor normal. No rashes or lesions Neurologic: Alert and oriented X 3, normal strength and tone. Normal symmetric reflexes. Normal coordination and gait    Assessment:    Healthy female exam.     Intermittent Nausea and vomiting intentional weight loss   Plan:  Well appearing, no acute findings on exam. Hx of chronic recurrent N/V. Currently asymptomatic. She lost about 13 lbs in the last 4 months. Per mom, it was intentional. Unclear reason for N/V. Differentials include medication induced (birth control), pregnancy (unlikely since she has never been sexually  active), metabolic or electrolyte problem. She declined Upreg today. Check Cmet, TSH. I will contact her with test result. Tdap given today.

## 2018-08-29 LAB — CMP14+EGFR
ALT: 14 IU/L (ref 0–32)
AST: 17 IU/L (ref 0–40)
Albumin/Globulin Ratio: 2.4 — ABNORMAL HIGH (ref 1.2–2.2)
Albumin: 4.7 g/dL (ref 3.9–5.0)
Alkaline Phosphatase: 56 IU/L (ref 39–117)
BUN/Creatinine Ratio: 13 (ref 9–23)
BUN: 11 mg/dL (ref 6–20)
Bilirubin Total: 0.7 mg/dL (ref 0.0–1.2)
CO2: 24 mmol/L (ref 20–29)
Calcium: 9.5 mg/dL (ref 8.7–10.2)
Chloride: 100 mmol/L (ref 96–106)
Creatinine, Ser: 0.88 mg/dL (ref 0.57–1.00)
GFR calc Af Amer: 109 mL/min/{1.73_m2} (ref >59)
GFR calc non Af Amer: 95 mL/min/{1.73_m2} (ref >59)
Globulin, Total: 2 g/dL (ref 1.5–4.5)
Glucose: 86 mg/dL (ref 65–99)
Potassium: 4.5 mmol/L (ref 3.5–5.2)
Sodium: 140 mmol/L (ref 134–144)
Total Protein: 6.7 g/dL (ref 6.0–8.5)

## 2018-08-29 LAB — TSH: TSH: 1.36 u[IU]/mL (ref 0.450–4.500)

## 2018-08-29 NOTE — Progress Notes (Signed)
   Subjective:  Patient presents today status post lapidus bunionectomy of the left foot. DOS: 08/10/17. She states she is doing well. She reports sharp pain around the incision site that occurred two days ago that has since resolved. There are no modifying factors noted. Patient is here for further evaluation and treatment.    Past Medical History:  Diagnosis Date  . Headache   . Menorrhagia 01/09/2012  . Sleep deprivation 02/17/2014      Objective/Physical Exam Neurovascular status intact.  Skin incisions appear to be well coapted. No sign of infectious process noted. No dehiscence. No active bleeding noted. Moderate edema noted to the surgical extremity. Upon weightbearing there is a medial longitudinal arch collapse bilaterally. Remove foot valgus noted to the bilateral lower extremities with excessive pronation upon mid stance.  Radiographic Exam:  Orthopedic hardware and osteotomies sites appear to be stable with routine healing.  Assessment: 1. s/p lapidus bunionectomy left. DOS: 08/10/17 2. Acute pain left foot - resolved 3. Pes planus bilateral  4. Capsulitis left foot - intermittent   Plan of Care:  1. Patient was evaluated. X-Rays reviewed.  2. Recommended good shoe gear.  3. Appointment with Raiford Noble, Pedorthist, for custom molded orthotics.  4. Return to clinic as needed.      Felecia Shelling, DPM [facility]  Dr. Felecia Shelling, DPM    [address]                                        [place], [place] [address]                Office [phone]  Fax [phone]

## 2018-09-03 ENCOUNTER — Other Ambulatory Visit: Payer: Self-pay

## 2018-09-03 ENCOUNTER — Ambulatory Visit: Admitting: Orthotics

## 2018-09-03 DIAGNOSIS — M2012 Hallux valgus (acquired), left foot: Secondary | ICD-10-CM

## 2018-09-03 DIAGNOSIS — Q6650 Congenital pes planus, unspecified foot: Secondary | ICD-10-CM

## 2018-09-03 DIAGNOSIS — M778 Other enthesopathies, not elsewhere classified: Secondary | ICD-10-CM

## 2018-09-03 NOTE — Progress Notes (Signed)
Patient declined casting/f/o due to financial considerations ([facility] does not cover)

## 2019-01-25 ENCOUNTER — Encounter: Payer: Self-pay | Admitting: Family Medicine

## 2019-01-25 ENCOUNTER — Other Ambulatory Visit: Payer: Self-pay

## 2019-01-25 ENCOUNTER — Ambulatory Visit (INDEPENDENT_AMBULATORY_CARE_PROVIDER_SITE_OTHER): Admitting: Family Medicine

## 2019-01-25 VITALS — BP 110/64 | HR 82 | Wt 138.4 lb

## 2019-01-25 DIAGNOSIS — F331 Major depressive disorder, recurrent, moderate: Secondary | ICD-10-CM

## 2019-01-25 NOTE — Patient Instructions (Signed)
Coping With Depression Depression is an experience of feeling down, blue, or sad. Depression can affect your thoughts and feelings, relationships, daily activities, and physical health. It is caused by changes in your brain that can be triggered by stress in your life or a serious loss. Everyone experiences occasional disappointment, sadness, and loss in their lives. When you are feeling down, blue, or sad for at least 2 weeks in a row, it may mean that you have depression. If you receive a diagnosis of depression, your health care provider will tell you which type of depression you have and the possible treatments to help. How can depression affect me? Being depressed can make daily activities more difficult. It can negatively affect your daily life, from school and sports performance to work and relationships. When you are depressed, you may:  Want to be alone.  Avoid interacting with others.  Avoid doing the things you usually like to do.  Notice changes in your sleep habits.  Find it harder than usual to wake up and go to school or work.  Feel angry at everyone.  Feel like you do not have any patience.  Have trouble concentrating.  Feel tired all the time.  Notice changes in your appetite.  Lose or gain weight without trying.  Have constant headaches or stomachaches.  Think about death or attempting suicide often. What are things I can do to deal with depression? If you have had symptoms of depression for more than 2 weeks, talk with your parents or an adult you trust, such as a Social worker at school or church or a Leisure centre manager. You might be tempted to only tell friends, but you should tell an adult too. The hardest step in dealing with depression is admitting that you are feeling it to someone. The more people who know, the more likely you will be to get some help. Certain types of counseling can be very helpful in treating depression. A counseling professional can assess what treatments  are going to be most helpful for you. These may include:  Talk therapy.  Medicines.  Brain stimulation therapy. There are a number of other things you can do that can help you cope with depression on a daily basis, including:  Spending time in nature.  Spending time with trusted friends who help you feel better.  Taking time to think about the positive things in your life and to feel grateful for them.  Exercising, such as playing an active game with some friends or going for a run.  Spending less time using electronics, especially at night before bed. The screens of TVs, computers, tablets, and phones make your brain think it is time to get up rather than go to bed.  Avoiding spending too much time spacing out on TV or video games. This might feel good for a while, but it ends up just being a way to avoid the feelings of depression. What should I do if my depression gets worse? If you are having trouble managing your depression or if your depression gets worse, talk to your health care provider about making adjustments to your treatment plan. You should get help immediately if:  You feel suicidal and are making a plan to commit suicide.  You are drinking or using drugs to stop the pain from your depression.  You are cutting yourself or thinking about cutting yourself.  You are thinking about hurting others and are making a plan to do so.  You believe the world would  be better off without you in it.  You are isolating yourself completely and not talking with anyone. If you find yourself in any of these situations, you should do one of the following:  Immediately tell your parents or best friend.  Call and go see your health care provider or health professional.  Call the suicide prevention hotline (3657260105 in the [place]  Text the crisis line 708-542-1446 in the [place] Where can I get support? It is important to know that although depression is serious, you can find  support from a variety of sources. Sources of help may include:  Suicide prevention, crisis prevention, and depression hotlines.  School teachers, counselors, Systems developer, or clergy.  Parents or other family members.  Support groups. You can locate a counselor or support group in your area from one of the following sources:  Mental Health America: www.mentalhealthamerica.net  Anxiety and Depression Association of [place] (ADAA): ProgramCam.de  The [employer] on Mental Illness (NAMI): www.nami.org This information is not intended to replace advice given to you by your health care provider. Make sure you discuss any questions you have with your health care provider. Document Released: 04/03/2015 Document Revised: 02/24/2017 Document Reviewed: 04/03/2015 Elsevier Patient Education  2020 [employer].

## 2019-01-25 NOTE — Progress Notes (Signed)
Subjective:   Kiara Mcfarland is an 23 y.o. female who presents for evaluation and treatment of depressive symptoms. Dad not in her life, school issue, she is depressed and easily irritated. Therapist Gabriel Cirri at [facility].   Onset approximately 1 year ago, waxes and wane since that time.  Current symptoms include depressed mood, anger.  Current treatment for depression:Therapist weekly x 1 month Sleep problems: Absent   Early awakening:Moderate   Energy: Good Motivation: Limited Concentration: Good Rumination/worrying: Absent Memory: Good Tearfulness: Moderate  Anxiety: Absent  Panic: Absent  Overall Mood: Moderately worse  Hopelessness: Moderate Suicidal ideation: Absent  Other/Psychosocial Stressors: School and dad  Not in her life. She does not feel like she is learning with online learning Family history positive for depression in the patient's mother.  Previous treatment modalities employed include Individual therapy.  Past episodes of depression: Yes in 2019 Organic causes of depression present: None.  Review of Systems Pertinent items noted in HPI and remainder of comprehensive ROS otherwise negative.   Objective:   Mental Status Examination: Posture and motor behavior: Appropriate Dress, grooming, personal hygiene: Appropriate Facial expression: depressed Speech: Appropriate Mood: Depressed Coherency and relevance of thought: Appropriate Thought content: Appropriate Perceptions: Appropriate Orientation:Appropriate Attention and concentration: Appropriate Memory: : Appropriate Vocabulary: Appropriate Abstract reasoning: Appropriate Judgment: Appropriate   Teary during exam   Office Visit from 01/25/2019 in [facility]  PHQ-9 Total Score  9     GAD 7 : Generalized Anxiety Score 01/25/2019  Nervous, Anxious, on Edge 0  Control/stop worrying 1  Worry too much - different things 1  Trouble relaxing 0  Restless 0  Easily  annoyed or irritable 3  Afraid - awful might happen 0  Total GAD 7 Score 5  Anxiety Difficulty Not difficult at all      Assessment:   Experiencing the following symptoms of depression most of the day nearly every day for more than two consecutive weeks: depressed mood, loss of interests/pleasure, thoughts of worthlessness or guilt. Currently not suicidal  Depressive Disorder: Active  Suicide Risk Assessment:  Suicidal intent: None Suicidal plan: No Access to means for suicide: No Lethality of means for suicide: No Prior suicide attempts: No Recent exposure to suicide:No    Plan:  MDD SSRI + Counseling recommended. She declined pharmacologic agents now. I encouraged mom support at home. I recommended increasing therapy to twice weekly rather than once weekly. Return precaution discussed. Reviewed concept of depression as biochemical imbalance of neurotransmitters and rationale for treatment. Instructed patient to contact office or on-call physician promptly should condition worsen or any new symptoms appear and provided on-call telephone numbers.   F/U in 2 weeks.  More than 50% of this 25 minute face to face encounter was spent on counseling.

## 2019-02-19 ENCOUNTER — Ambulatory Visit (INDEPENDENT_AMBULATORY_CARE_PROVIDER_SITE_OTHER): Admitting: Family Medicine

## 2019-02-19 ENCOUNTER — Encounter: Payer: Self-pay | Admitting: Family Medicine

## 2019-02-19 ENCOUNTER — Other Ambulatory Visit: Payer: Self-pay

## 2019-02-19 DIAGNOSIS — F320 Major depressive disorder, single episode, mild: Secondary | ICD-10-CM

## 2019-02-19 DIAGNOSIS — F329 Major depressive disorder, single episode, unspecified: Secondary | ICD-10-CM | POA: Insufficient documentation

## 2019-02-19 NOTE — Patient Instructions (Signed)
Coping With Depression, Teen Depression is an experience of feeling down, blue, or sad. Depression can affect your thoughts and feelings, relationships, daily activities, and physical health. It is caused by changes in your brain that can be triggered by stress in your life or a serious loss. Everyone experiences occasional disappointment, sadness, and loss in their lives. When you are feeling down, blue, or sad for at least 2 weeks in a row, it may mean that you have depression. If you receive a diagnosis of depression, your health care provider will tell you which type of depression you have and the possible treatments to help. How can depression affect me? Being depressed can make daily activities more difficult. It can negatively affect your daily life, from school and sports performance to work and relationships. When you are depressed, you may:  Want to be alone.  Avoid interacting with others.  Avoid doing the things you usually like to do.  Notice changes in your sleep habits.  Find it harder than usual to wake up and go to school or work.  Feel angry at everyone.  Feel like you do not have any patience.  Have trouble concentrating.  Feel tired all the time.  Notice changes in your appetite.  Lose or gain weight without trying.  Have constant headaches or stomachaches.  Think about death or attempting suicide often. What are things I can do to deal with depression? If you have had symptoms of depression for more than 2 weeks, talk with your parents or an adult you trust, such as a counselor at school or church or a coach. You might be tempted to only tell friends, but you should tell an adult too. The hardest step in dealing with depression is admitting that you are feeling it to someone. The more people who know, the more likely you will be to get some help. Certain types of counseling can be very helpful in treating depression. A counseling professional can assess what  treatments are going to be most helpful for you. These may include:  Talk therapy.  Medicines.  Brain stimulation therapy. There are a number of other things you can do that can help you cope with depression on a daily basis, including:  Spending time in nature.  Spending time with trusted friends who help you feel better.  Taking time to think about the positive things in your life and to feel grateful for them.  Exercising, such as playing an active game with some friends or going for a run.  Spending less time using electronics, especially at night before bed. The screens of TVs, computers, tablets, and phones make your brain think it is time to get up rather than go to bed.  Avoiding spending too much time spacing out on TV or video games. This might feel good for a while, but it ends up just being a way to avoid the feelings of depression. What should I do if my depression gets worse? If you are having trouble managing your depression or if your depression gets worse, talk to your health care provider about making adjustments to your treatment plan. You should get help immediately if:  You feel suicidal and are making a plan to commit suicide.  You are drinking or using drugs to stop the pain from your depression.  You are cutting yourself or thinking about cutting yourself.  You are thinking about hurting others and are making a plan to do so.  You believe the world   would be better off without you in it.  You are isolating yourself completely and not talking with anyone. If you find yourself in any of these situations, you should do one of the following:  Immediately tell your parents or best friend.  Call and go see your health care provider or health professional.  Call the suicide prevention hotline (1-800-273-8255 in the [place]  Text the crisis line (741741 in the [place] Where can I get support? It is important to know that although depression is serious, you  can find support from a variety of sources. Sources of help may include:  Suicide prevention, crisis prevention, and depression hotlines.  School teachers, counselors, coaches, or clergy.  Parents or other family members.  Support groups. You can locate a counselor or support group in your area from one of the following sources:  Mental Health America: www.mentalhealthamerica.net  Anxiety and Depression Association of America (ADAA): www.adaa.org  National Alliance on Mental Illness (NAMI): www.nami.org This information is not intended to replace advice given to you by your health care provider. Make sure you discuss any questions you have with your health care provider. Document Released: 04/03/2015 Document Revised: 02/24/2017 Document Reviewed: 04/03/2015 Elsevier Patient Education  2020 Elsevier Inc.  

## 2019-02-19 NOTE — Assessment & Plan Note (Signed)
Mild depression. PHQ9 improved. She declines Pharmacologic agent which is appropriate at this time. She prefer to continue with her current Psychologist whom she had been with for many years. She is non-suicidal or homicidal and safe for home monitoring. I discussed some stress relieve mechanism with her. F/U with me in 2-4 weeks.

## 2019-02-19 NOTE — Progress Notes (Signed)
  Subjective:     Patient ID: Kiara Mcfarland, female   DOB: December 06, 1995, 23 y.o.   MRN: [id number]  HPI Depression: She is here for follow-up. She seems to have improved a bit per her PHQ9, although she stated her symptoms is about the same but intermittent. She is yet to have a full visit with her Psychologist because she has not completed the pre-visit assignment given to her. LMP:02/18/19. She denies any correlation between her period and her mood. She is current off school for the holidays. She enjoys working at [employer]. No stressor at home. She denies any form of abuse. She is not in any sexual relationship.  Current Outpatient Medications on File Prior to Visit  Medication Sig Dispense Refill  . MILI 0.25-35 MG-MCG tablet TAKE 1 TABLET BY MOUTH ONCE DAILY 84 tablet 11  . ibuprofen (ADVIL,MOTRIN) 400 MG tablet take 1 tablet by mouth every 8 hours if needed (Patient not taking: Reported on 08/28/2018) 90 tablet 1   No current facility-administered medications on file prior to visit.    Vitals:   02/19/19 0959  BP: 98/62  Pulse: 61  SpO2: 99%  Weight: 140 lb 12.8 oz (63.9 kg)     Review of Systems  Respiratory: Negative.   Cardiovascular: Negative.   Gastrointestinal: Negative.   Genitourinary: Negative.   Psychiatric/Behavioral: Negative for agitation, behavioral problems, self-injury and suicidal ideas. The patient is not nervous/anxious.   All other systems reviewed and are negative.      Objective:   Physical Exam Vitals signs and nursing note reviewed.  Constitutional:      Appearance: Normal appearance.  Cardiovascular:     Rate and Rhythm: Normal rate and regular rhythm.     Pulses: Normal pulses.     Heart sounds: Normal heart sounds. No murmur.  Pulmonary:     Effort: Pulmonary effort is normal. No respiratory distress.     Breath sounds: Normal breath sounds. No stridor. No wheezing or rhonchi.  Abdominal:     General: Abdomen is flat. Bowel  sounds are normal. There is no distension.     Palpations: There is no mass.     Tenderness: There is no abdominal tenderness.  Neurological:     Mental Status: She is alert.  Psychiatric:        Attention and Perception: Attention normal.        Mood and Affect: Mood is depressed.        Speech: Speech normal.        Behavior: Behavior normal.        Thought Content: Thought content normal. Thought content does not include homicidal or suicidal ideation. Thought content does not include homicidal or suicidal plan.      Office Visit from 02/19/2019 in [facility]  PHQ-9 Total Score  3         Assessment:     MDD    Plan:     Check problem list.

## 2019-03-13 ENCOUNTER — Other Ambulatory Visit: Payer: Self-pay

## 2019-03-13 ENCOUNTER — Ambulatory Visit (INDEPENDENT_AMBULATORY_CARE_PROVIDER_SITE_OTHER): Admitting: *Deleted

## 2019-03-13 DIAGNOSIS — Z111 Encounter for screening for respiratory tuberculosis: Secondary | ICD-10-CM

## 2019-03-13 NOTE — Progress Notes (Signed)
Patient is here for a PPD placement.  PPD placed in left forearm.  Patient will return 03/15/19 to have PPD read. Christen Bame, CMA

## 2019-03-15 ENCOUNTER — Other Ambulatory Visit: Payer: Self-pay

## 2019-03-15 ENCOUNTER — Ambulatory Visit (INDEPENDENT_AMBULATORY_CARE_PROVIDER_SITE_OTHER)

## 2019-03-15 DIAGNOSIS — Z111 Encounter for screening for respiratory tuberculosis: Secondary | ICD-10-CM

## 2019-03-15 LAB — TB SKIN TEST
Induration: 0 mm
TB Skin Test: NEGATIVE

## 2019-03-15 NOTE — Progress Notes (Signed)
Patient is here for a PPD read.  It was placed on 03/13/2019 in the left forearm @ 1430.    PPD RESULTS:  Result: negative Induration: 0 mm  Of note slight red reaction to needle.   Letter created and given to patient for documentation purposes. Talbot Grumbling, RN

## 2019-03-27 ENCOUNTER — Other Ambulatory Visit: Payer: Self-pay | Admitting: *Deleted

## 2019-03-27 ENCOUNTER — Telehealth: Payer: Self-pay | Admitting: *Deleted

## 2019-03-27 NOTE — Telephone Encounter (Signed)
You can open up a 9 AM slot for her on January 4th. Advise ED visit if symptoms worsens.

## 2019-03-27 NOTE — Telephone Encounter (Signed)
Pt called in to schedule an appt for anxiety meds but she didn't want to see anybody but you but you dont have anything until feb. Please advise. Kiara Mcfarland Kennon Holter, CMA

## 2019-03-29 DIAGNOSIS — Z8616 Personal history of COVID-19: Secondary | ICD-10-CM

## 2019-03-29 HISTORY — DX: Personal history of COVID-19: Z86.16

## 2019-04-01 ENCOUNTER — Telehealth (INDEPENDENT_AMBULATORY_CARE_PROVIDER_SITE_OTHER): Admitting: Family Medicine

## 2019-04-01 ENCOUNTER — Encounter: Payer: Self-pay | Admitting: Family Medicine

## 2019-04-01 ENCOUNTER — Other Ambulatory Visit: Payer: Self-pay

## 2019-04-01 DIAGNOSIS — F320 Major depressive disorder, single episode, mild: Secondary | ICD-10-CM | POA: Diagnosis not present

## 2019-04-01 MED ORDER — ESCITALOPRAM OXALATE 10 MG PO TABS: 10.0000 mg | ORAL_TABLET | Freq: Every day | ORAL | 2 refills | Status: DC

## 2019-04-01 NOTE — Assessment & Plan Note (Signed)
No improvement despite counseling. She will like to trial meds in addition. Her PHQ9 score of 6 I.e mild depression. I will start her on a low dose SSRI. I advised her to f/u in 4 weeks for reassessment. She verbalized understanding and agreed with the plan.

## 2019-04-01 NOTE — Progress Notes (Addendum)
[facility] Telemedicine Visit  Patient consented to have virtual visit. Method of visit: Telephone  Encounter participants: Patient: Kiara Mcfarland - located at In the car Provider: Janit Pagan - located at Office Others (if applicable): N/A  Chief Complaint: Depression not improving  HPI:  Depression: Patient was driving home when I called. I asked if I needed to call back. However, she send that she is using hands free call. She stated that her depression is not getting better despite therapy and will like to get on meds. Denies any new concern. She is not suicidal or homicidal.  Patient denies being currently sexually active. She is on birth control and her LMP was 03/20/19.  ROS: per HPI  Pertinent PMHx: Problem list reviewed.  Exam:  Respiratory: Not in distress over the telephone   Telemedicine from 04/01/2019 in [facility]  PHQ-9 Total Score  6       Assessment/Plan:  MDD (major depressive disorder) No improvement despite counseling. She will like to trial meds in addition. Her PHQ9 score of 6 I.e mild depression. I will start her on a low dose SSRI. I advised her to f/u in 4 weeks for reassessment. She verbalized understanding and agreed with the plan.    Time spent during visit with patient: 15 minutes

## 2019-04-01 NOTE — Addendum Note (Signed)
Addended by: Janit Pagan T on: 04/01/2019 09:16 AM   Modules accepted: Orders

## 2019-07-30 ENCOUNTER — Other Ambulatory Visit: Payer: Self-pay | Admitting: *Deleted

## 2019-07-30 MED ORDER — NORGESTIMATE-ETH ESTRADIOL 0.25-35 MG-MCG PO TABS: 1.0000 | ORAL_TABLET | Freq: Every day | ORAL | 11 refills | Status: DC

## 2019-08-11 ENCOUNTER — Encounter (HOSPITAL_COMMUNITY): Payer: Self-pay

## 2019-08-11 ENCOUNTER — Other Ambulatory Visit: Payer: Self-pay

## 2019-08-11 ENCOUNTER — Emergency Department (HOSPITAL_COMMUNITY)
Admission: EM | Admit: 2019-08-11 | Discharge: 2019-08-11 | Disposition: A | Discharge status: Home / Self Care | Attending: Emergency Medicine | Admitting: Emergency Medicine

## 2019-08-11 DIAGNOSIS — J029 Acute pharyngitis, unspecified: Secondary | ICD-10-CM | POA: Diagnosis not present

## 2019-08-11 DIAGNOSIS — J302 Other seasonal allergic rhinitis: Secondary | ICD-10-CM | POA: Diagnosis not present

## 2019-08-11 DIAGNOSIS — H6121 Impacted cerumen, right ear: Secondary | ICD-10-CM

## 2019-08-11 LAB — GROUP A STREP BY PCR: Group A Strep by PCR: NOT DETECTED

## 2019-08-11 MED ORDER — CARBAMIDE PEROXIDE 6.5 % OT SOLN
10.0000 [drp] | Freq: Once | OTIC | Status: AC
  Administered 2019-08-11: 10 [drp] via OTIC
  Filled 2019-08-11: qty 15

## 2019-08-11 NOTE — ED Provider Notes (Signed)
[facility]-EMERGENCY DEPT Provider Note   CSN: [id number] Arrival date & time: 08/11/19  1018     History Chief Complaint  Patient presents with  . Hearing Loss  . Sore Throat    Kiara Mcfarland is a 24 y.o. female.  HPI HPI Comments: Kiara Mcfarland is a 24 y.o. female who presents to the Emergency Department complaining of right ear pain and sore throat. Pt states she started experiencing sore throat two nights ago. She has been drinking warm tea with mild relief. She additionally reports mild congestion and rhinorrhea as well as general malaise. Today at work she experienced sudden onset mild right ear pain with "muffled" hearing in the right ear. No ear discharge. She denies fevers, chills, cough, CP, GU complaints, syncope.       Past Medical History:  Diagnosis Date  . Headache   . Menorrhagia 01/09/2012  . Sleep deprivation 02/17/2014    Patient Active Problem List   Diagnosis Date Noted  . MDD (major depressive disorder) 02/19/2019  . 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 Hallux valgus of left foot 03/10/2017  . Migraine without aura and without status migrainosus, not intractable 02/17/2014  . Episodic tension type headache 02/17/2014    Past Surgical History:  Procedure Laterality Date  . BUNIONECTOMY       OB History   No obstetric history on file.     Family History  Problem Relation Age of Onset  . Diabetes Maternal Grandmother   . Hypertension Maternal Grandmother   . Stroke Maternal Grandmother        Died at 25  . Diabetes Maternal Grandfather        Died at 23  . Hypertension Maternal Grandfather   . Migraines Father     Social History   Tobacco Use  . Smoking status: Passive Smoke Exposure - Never Smoker  . Smokeless tobacco: Never Used  Substance Use Topics  . Alcohol use: No  . Drug use: No    Home Medications Prior to Admission medications   Medication Sig Start Date End Date  Taking? Authorizing Provider  escitalopram (LEXAPRO) 10 MG tablet Take 1 tablet (10 mg total) by mouth daily. 04/01/19   Doreene Eland, MD  ibuprofen (ADVIL,MOTRIN) 400 MG tablet take 1 tablet by mouth every 8 hours if needed Patient not taking: Reported on 08/28/2018 12/08/16   Doreene Eland, MD  norgestimate-ethinyl estradiol (MILI) 0.25-35 MG-MCG tablet Take 1 tablet by mouth daily. 07/30/19   Doreene Eland, MD    Allergies    Nickel  Review of Systems   Review of Systems  Constitutional: Positive for activity change and fatigue. Negative for chills and fever.  HENT: Positive for congestion, ear pain, postnasal drip, rhinorrhea and sore throat. Negative for ear discharge, facial swelling, tinnitus, trouble swallowing and voice change.   Respiratory: Negative for cough.   Cardiovascular: Negative for chest pain.  Gastrointestinal: Negative for abdominal pain, diarrhea, nausea and vomiting.  Genitourinary: Negative for difficulty urinating, dysuria, hematuria and urgency.  Neurological: Positive for weakness. Negative for dizziness, syncope and numbness.   Physical Exam Updated Vital Signs BP 132/78 (BP Location: Left Arm)   Pulse 63   Temp 98.3 F (36.8 C) (Oral)   Resp 18   Ht 5\' 5"  (1.651 m)   Wt 63.5 kg   LMP 07/21/2019 (Approximate)   SpO2 100%   BMI 23.30 kg/m   Physical Exam Vitals and  nursing note reviewed.  Constitutional:      General: She is not in acute distress.    Appearance: Normal appearance. She is well-developed and normal weight. She is not ill-appearing, toxic-appearing or diaphoretic.  HENT:     Head: Normocephalic and atraumatic.     Right Ear: External ear normal.     Left Ear: Tympanic membrane, ear canal and external ear normal. No drainage, swelling or tenderness.  No middle ear effusion. Tympanic membrane is not erythematous.     Ears:     Comments: Cerumen impaction noted in the right EAC.     Nose: Congestion present. No rhinorrhea.      Comments: Boggy nasal turbinates noted bilaterally    Mouth/Throat:     Mouth: Mucous membranes are moist.     Pharynx: Oropharynx is clear. Posterior oropharyngeal erythema (red streaking noted in posterior orophayrnx) present. No oropharyngeal exudate.     Tonsils: No tonsillar exudate or tonsillar abscesses. 1+ on the right. 1+ on the left.  Eyes:     Extraocular Movements: Extraocular movements intact.     Right eye: Normal extraocular motion.     Left eye: Normal extraocular motion.     Conjunctiva/sclera: Conjunctivae normal.     Pupils: Pupils are equal, round, and reactive to light.  Cardiovascular:     Rate and Rhythm: Normal rate and regular rhythm.     Pulses: Normal pulses.     Heart sounds: Normal heart sounds. No murmur. No friction rub. No gallop.   Pulmonary:     Effort: Pulmonary effort is normal. No respiratory distress.     Breath sounds: Normal breath sounds. No stridor. No wheezing, rhonchi or rales.  Abdominal:     General: Abdomen is flat.     Palpations: Abdomen is soft.     Tenderness: There is no abdominal tenderness.  Musculoskeletal:        General: Normal range of motion.     Cervical back: Normal range of motion and neck supple. No tenderness.  Skin:    General: Skin is warm and dry.  Neurological:     General: No focal deficit present.     Mental Status: She is alert and oriented to person, place, and time.  Psychiatric:        Mood and Affect: Mood normal.        Behavior: Behavior normal.    ED Results / Procedures / Treatments   Labs (all labs ordered are listed, but only abnormal results are displayed) Labs Reviewed - No data to display  EKG None  Radiology No results found.  Procedures Procedures (including critical care time)  Medications Ordered in ED Medications  carbamide peroxide (DEBROX) 6.5 % OTIC (EAR) solution 10 drop (has no administration in time range)    ED Course  I have reviewed the triage vital signs and the  nursing notes.  Pertinent labs & imaging results that were available during my care of the patient were reviewed by me and considered in my medical decision making (see chart for details).    MDM Rules/Calculators/A&P                      11:18 AM pt is a pleasant 24 year old female with a history of strep throat. She has been told she needs to "have her tonsils taken out" in the past but has never been evaluated by ENT. She does have some red streaking in the posterior oropharynx.  Will obtain rapid strep. Right EAC is occluded. Will have nursing staff irrigate her right ear. Will reevaluate.   12:50 PM right ear irrigated with mild relief. I removed a significant amount of cerumen in the right EAC with a curette. Rapid strep test was negative. Recommended symptomatic management of her symptoms. Continue with warm tea and honey. Recommended salt water rinses. Recommended that she begin taking Claritin or Zyrtec daily. We discussed proper ear management and stopping the use of Q-tips. Recommended continued use of Debrox drops. Water and hydrogen peroxide rinses once or twice a month. She understands she can return to the emergency department if her symptoms worsen. Her questions were answered and she was amicable at the time of discharge. Her vital signs are stable.  Patient discharged to home/self care.  Condition at discharge: Stable  Note: Portions of this report may have been transcribed using voice recognition software. Every effort was made to ensure accuracy; however, inadvertent computerized transcription errors may be present.    Final Clinical Impression(s) / ED Diagnoses Final diagnoses:  Sore throat  Impacted cerumen of right ear  Seasonal allergies    Rx / DC Orders ED Discharge Orders    None       Rayna Sexton, PA-C 08/11/19 1252    Lacretia Leigh, MD 08/13/19 1551

## 2019-08-11 NOTE — Discharge Instructions (Addendum)
Per our discussion, I would recommend that you begin taking an antihistamine for your allergies. You can take Zyrtec which is also known as cetirizine. Or you can take Claritin which is also known as loratadine. You take these medicines once a day in the morning.  Continue with salt water gargles and drink warm tea and honey.  When you run out of drops for your ear I would recommend purchasing more Debrox drops. You can use these daily to soften the wax in your ears. Please continue to rinse them out in the shower. You can also do rinses in your ears with half peroxide and water. I would recommend not using peroxide rinses more than once every week or 2. Please STOP using Q-tips.   If you have any worsening symptoms please return to the emergency department for reevaluation. It was a pleasure to meet you.

## 2019-08-11 NOTE — ED Triage Notes (Signed)
Patient c/o hearing loss of the right ear and sore throat x 2 days.

## 2019-08-20 ENCOUNTER — Other Ambulatory Visit: Payer: Self-pay | Admitting: *Deleted

## 2019-08-20 MED ORDER — ESCITALOPRAM OXALATE 10 MG PO TABS: 10.0000 mg | ORAL_TABLET | Freq: Every day | ORAL | 1 refills | Status: DC

## 2019-09-24 ENCOUNTER — Encounter: Admitting: Family Medicine

## 2020-01-22 ENCOUNTER — Encounter: Payer: Self-pay | Admitting: Family Medicine

## 2020-01-24 ENCOUNTER — Encounter: Payer: Self-pay | Admitting: Family Medicine

## 2020-01-24 ENCOUNTER — Other Ambulatory Visit: Payer: Self-pay

## 2020-01-24 ENCOUNTER — Ambulatory Visit (INDEPENDENT_AMBULATORY_CARE_PROVIDER_SITE_OTHER): Payer: BC Managed Care – PPO | Admitting: Family Medicine

## 2020-01-24 ENCOUNTER — Other Ambulatory Visit (HOSPITAL_COMMUNITY)
Admission: RE | Admit: 2020-01-24 | Discharge: 2020-01-24 | Disposition: A | Discharge status: Home / Self Care | Payer: 59 | Source: Ambulatory Visit | Attending: Family Medicine | Admitting: Family Medicine

## 2020-01-24 VITALS — BP 110/64 | HR 61 | Ht 65.0 in | Wt 140.0 lb

## 2020-01-24 DIAGNOSIS — Z124 Encounter for screening for malignant neoplasm of cervix: Secondary | ICD-10-CM | POA: Insufficient documentation

## 2020-01-24 DIAGNOSIS — Z0000 Encounter for general adult medical examination without abnormal findings: Secondary | ICD-10-CM

## 2020-01-24 DIAGNOSIS — Z1159 Encounter for screening for other viral diseases: Secondary | ICD-10-CM

## 2020-01-24 NOTE — Progress Notes (Signed)
Subjective:     Kiara Mcfarland is a 24 y.o. female and is here for a comprehensive physical exam. The patient reports no problems. Last Sexual activity was August 2021. LMP: 01/20/20 D/C birth control due to weight gain, and her period is better. She is not trying to get pregnant and does not want to be on birth control for now. Denies abuse. Works at [employer].  Depression/SI: Symptoms improved, hence she d/c her SSRI. She was supposed to schedule a therapist appointment today, however, her insurance is still pending. She will schedule f/u soon. Her last SI thought was two weeks ago. No plan, just a feeling of not wanting to be around no more.  Social History   Socioeconomic History  . Marital status: Single    Spouse name: Not on file  . Number of children: Not on file  . Years of education: Not on file  . Highest education level: Not on file  Occupational History  . Not on file  Tobacco Use  . Smoking status: Passive Smoke Exposure - Never Smoker  . Smokeless tobacco: Never Used  Vaping Use  . Vaping Use: Never used  Substance and Sexual Activity  . Alcohol use: No  . Drug use: No  . Sexual activity: Never  Other Topics Concern  . Not on file  Social History Narrative   Lives with mom and her brother (one year younger)   Social Determinants of Health   Financial Resource Strain:   . Difficulty of Paying Living Expenses: Not on file  Food Insecurity:   . Worried About Programme researcher, broadcasting/film/video in the Last Year: Not on file  . Ran Out of Food in the Last Year: Not on file  Transportation Needs:   . Lack of Transportation (Medical): Not on file  . Lack of Transportation (Non-Medical): Not on file  Physical Activity:   . Days of Exercise per Week: Not on file  . Minutes of Exercise per Session: Not on file  Stress:   . Feeling of Stress : Not on file  Social Connections:   . Frequency of Communication with Friends and Family: Not on file  . Frequency  of Social Gatherings with Friends and Family: Not on file  . Attends Religious Services: Not on file  . Active Member of Clubs or Organizations: Not on file  . Attends Banker Meetings: Not on file  . Marital Status: Not on file  Intimate Partner Violence:   . Fear of Current or Ex-Partner: Not on file  . Emotionally Abused: Not on file  . Physically Abused: Not on file  . Sexually Abused: Not on file   Health Maintenance  Topic Date Due  . Hepatitis C Screening  Never done  . COVID-19 Vaccine (1) Never done  . PAP-Cervical Cytology Screening  Never done  . PAP SMEAR-Modifier  Never done  . INFLUENZA VACCINE  Never done  . TETANUS/TDAP  08/27/2028  . HIV Screening  Completed    The following portions of the patient's history were reviewed and updated as appropriate: allergies, current medications, past family history, past medical history, past social history, past surgical history and problem list.  Review of Systems Pertinent items noted in HPI and remainder of comprehensive ROS otherwise negative.   Objective:    BP 110/64   Pulse 61   Ht 5\' 5"  (1.651 m)   Wt 140 lb (63.5 kg)   LMP 01/20/2020 (Exact Date)  SpO2 98%   BMI 23.30 kg/m  General appearance: alert, cooperative and appears stated age Head: Normocephalic, without obvious abnormality, atraumatic Eyes: conjunctivae/corneas clear. PERRL, EOM's intact. Fundi benign. Ears: normal TM's and external ear canals both ears except for cerumen impaction B/L Throat: lips, mucosa, and tongue normal; teeth and gums normal Neck: no adenopathy, no carotid bruit, no JVD, supple, symmetrical, trachea midline and thyroid not enlarged, symmetric, no tenderness/mass/nodules Lungs: clear to auscultation bilaterally Heart: regular rate and rhythm, S1, S2 normal, no murmur, click, rub or gallop Abdomen: soft, non-tender; bowel sounds normal; no masses,  no organomegaly Pelvic: cervix normal in appearance, external  genitalia normal, no adnexal masses or tenderness, no cervical motion tenderness, rectovaginal septum normal, uterus normal size, shape, and consistency and vagina normal without discharge Extremities: extremities normal, atraumatic, no cyanosis or edema Skin: Skin color, texture, turgor normal. No rashes or lesions Lymph nodes: Cervical, supraclavicular, and axillary nodes normal. Neurologic: Alert and oriented X 3, normal strength and tone. Normal symmetric reflexes. Normal coordination and gait    Assessment:    Healthy female exam.      Plan:    Up to date with her vaccination. Connect with therapist for depression. Brief counseling done today regarding SI. She is currently not suicidal or homicidal. Hep C screening completed. PAP with GC/Chlamydia completed today. I will contact her soon with results. F/U as needed. See After Visit Summary for Counseling Recommendations

## 2020-01-24 NOTE — Patient Instructions (Signed)

## 2020-01-25 LAB — HEPATITIS C ANTIBODY: Hep C Virus Ab: 0.1 s/co ratio (ref 0.0–0.9)

## 2020-01-29 LAB — CYTOLOGY - PAP
Chlamydia: NEGATIVE
Comment: NEGATIVE
Comment: NEGATIVE
Comment: NORMAL
Diagnosis: NEGATIVE
Neisseria Gonorrhea: NEGATIVE
Trichomonas: NEGATIVE

## 2020-04-03 DIAGNOSIS — F329 Major depressive disorder, single episode, unspecified: Secondary | ICD-10-CM | POA: Diagnosis not present

## 2020-04-03 DIAGNOSIS — F411 Generalized anxiety disorder: Secondary | ICD-10-CM | POA: Diagnosis not present

## 2020-04-17 DIAGNOSIS — F329 Major depressive disorder, single episode, unspecified: Secondary | ICD-10-CM | POA: Diagnosis not present

## 2020-04-17 DIAGNOSIS — F411 Generalized anxiety disorder: Secondary | ICD-10-CM | POA: Diagnosis not present

## 2020-05-06 DIAGNOSIS — F329 Major depressive disorder, single episode, unspecified: Secondary | ICD-10-CM | POA: Diagnosis not present

## 2020-05-06 DIAGNOSIS — F411 Generalized anxiety disorder: Secondary | ICD-10-CM | POA: Diagnosis not present

## 2020-05-11 DIAGNOSIS — F411 Generalized anxiety disorder: Secondary | ICD-10-CM | POA: Diagnosis not present

## 2020-05-11 DIAGNOSIS — F329 Major depressive disorder, single episode, unspecified: Secondary | ICD-10-CM | POA: Diagnosis not present

## 2020-05-20 DIAGNOSIS — F411 Generalized anxiety disorder: Secondary | ICD-10-CM | POA: Diagnosis not present

## 2020-05-20 DIAGNOSIS — F329 Major depressive disorder, single episode, unspecified: Secondary | ICD-10-CM | POA: Diagnosis not present

## 2020-05-25 DIAGNOSIS — F329 Major depressive disorder, single episode, unspecified: Secondary | ICD-10-CM | POA: Diagnosis not present

## 2020-05-25 DIAGNOSIS — F411 Generalized anxiety disorder: Secondary | ICD-10-CM | POA: Diagnosis not present

## 2020-06-01 DIAGNOSIS — F411 Generalized anxiety disorder: Secondary | ICD-10-CM | POA: Diagnosis not present

## 2020-06-01 DIAGNOSIS — F329 Major depressive disorder, single episode, unspecified: Secondary | ICD-10-CM | POA: Diagnosis not present

## 2020-06-08 DIAGNOSIS — F329 Major depressive disorder, single episode, unspecified: Secondary | ICD-10-CM | POA: Diagnosis not present

## 2020-06-08 DIAGNOSIS — F411 Generalized anxiety disorder: Secondary | ICD-10-CM | POA: Diagnosis not present

## 2020-06-15 DIAGNOSIS — F329 Major depressive disorder, single episode, unspecified: Secondary | ICD-10-CM | POA: Diagnosis not present

## 2020-06-15 DIAGNOSIS — F411 Generalized anxiety disorder: Secondary | ICD-10-CM | POA: Diagnosis not present

## 2020-06-22 DIAGNOSIS — F329 Major depressive disorder, single episode, unspecified: Secondary | ICD-10-CM | POA: Diagnosis not present

## 2020-06-22 DIAGNOSIS — F411 Generalized anxiety disorder: Secondary | ICD-10-CM | POA: Diagnosis not present

## 2020-06-29 DIAGNOSIS — F329 Major depressive disorder, single episode, unspecified: Secondary | ICD-10-CM | POA: Diagnosis not present

## 2020-06-29 DIAGNOSIS — F411 Generalized anxiety disorder: Secondary | ICD-10-CM | POA: Diagnosis not present

## 2020-07-06 DIAGNOSIS — F411 Generalized anxiety disorder: Secondary | ICD-10-CM | POA: Diagnosis not present

## 2020-07-06 DIAGNOSIS — F329 Major depressive disorder, single episode, unspecified: Secondary | ICD-10-CM | POA: Diagnosis not present

## 2020-07-20 DIAGNOSIS — F329 Major depressive disorder, single episode, unspecified: Secondary | ICD-10-CM | POA: Diagnosis not present

## 2020-07-20 DIAGNOSIS — F411 Generalized anxiety disorder: Secondary | ICD-10-CM | POA: Diagnosis not present

## 2020-07-28 DIAGNOSIS — F329 Major depressive disorder, single episode, unspecified: Secondary | ICD-10-CM | POA: Diagnosis not present

## 2020-07-28 DIAGNOSIS — F411 Generalized anxiety disorder: Secondary | ICD-10-CM | POA: Diagnosis not present

## 2020-08-03 DIAGNOSIS — F329 Major depressive disorder, single episode, unspecified: Secondary | ICD-10-CM | POA: Diagnosis not present

## 2020-08-03 DIAGNOSIS — F411 Generalized anxiety disorder: Secondary | ICD-10-CM | POA: Diagnosis not present

## 2020-08-10 DIAGNOSIS — F329 Major depressive disorder, single episode, unspecified: Secondary | ICD-10-CM | POA: Diagnosis not present

## 2020-08-10 DIAGNOSIS — F411 Generalized anxiety disorder: Secondary | ICD-10-CM | POA: Diagnosis not present

## 2020-08-14 ENCOUNTER — Emergency Department (HOSPITAL_COMMUNITY)
Admission: EM | Admit: 2020-08-14 | Discharge: 2020-08-14 | Disposition: A | Discharge status: Home / Self Care | Payer: BC Managed Care – PPO | Attending: Emergency Medicine | Admitting: Emergency Medicine

## 2020-08-14 ENCOUNTER — Encounter (HOSPITAL_COMMUNITY): Payer: Self-pay

## 2020-08-14 ENCOUNTER — Other Ambulatory Visit: Payer: Self-pay

## 2020-08-14 DIAGNOSIS — S51851A Open bite of right forearm, initial encounter: Secondary | ICD-10-CM | POA: Diagnosis not present

## 2020-08-14 DIAGNOSIS — S81851A Open bite, right lower leg, initial encounter: Secondary | ICD-10-CM | POA: Insufficient documentation

## 2020-08-14 DIAGNOSIS — S51852A Open bite of left forearm, initial encounter: Secondary | ICD-10-CM | POA: Diagnosis not present

## 2020-08-14 DIAGNOSIS — Z7722 Contact with and (suspected) exposure to environmental tobacco smoke (acute) (chronic): Secondary | ICD-10-CM | POA: Diagnosis not present

## 2020-08-14 DIAGNOSIS — F419 Anxiety disorder, unspecified: Secondary | ICD-10-CM | POA: Insufficient documentation

## 2020-08-14 DIAGNOSIS — Z23 Encounter for immunization: Secondary | ICD-10-CM | POA: Insufficient documentation

## 2020-08-14 DIAGNOSIS — S81811A Laceration without foreign body, right lower leg, initial encounter: Secondary | ICD-10-CM | POA: Diagnosis not present

## 2020-08-14 DIAGNOSIS — W540XXA Bitten by dog, initial encounter: Secondary | ICD-10-CM | POA: Insufficient documentation

## 2020-08-14 DIAGNOSIS — S51811A Laceration without foreign body of right forearm, initial encounter: Secondary | ICD-10-CM | POA: Diagnosis not present

## 2020-08-14 MED ORDER — TETANUS-DIPHTH-ACELL PERTUSSIS 5-2.5-18.5 LF-MCG/0.5 IM SUSY
0.5000 mL | PREFILLED_SYRINGE | Freq: Once | INTRAMUSCULAR | Status: AC
  Administered 2020-08-14: 0.5 mL via INTRAMUSCULAR
  Filled 2020-08-14: qty 0.5

## 2020-08-14 MED ORDER — LIDOCAINE HCL 2 % IJ SOLN
20.0000 mL | Freq: Once | INTRAMUSCULAR | Status: AC
  Administered 2020-08-14: 400 mg
  Filled 2020-08-14: qty 20

## 2020-08-14 MED ORDER — AMOXICILLIN-POT CLAVULANATE 875-125 MG PO TABS
1.0000 | ORAL_TABLET | Freq: Once | ORAL | Status: AC
  Administered 2020-08-14: 1 via ORAL
  Filled 2020-08-14: qty 1

## 2020-08-14 MED ORDER — LORAZEPAM 1 MG PO TABS
1.0000 mg | ORAL_TABLET | Freq: Once | ORAL | Status: AC
  Administered 2020-08-14: 1 mg via ORAL
  Filled 2020-08-14: qty 1

## 2020-08-14 MED ORDER — OXYCODONE-ACETAMINOPHEN 5-325 MG PO TABS
2.0000 | ORAL_TABLET | Freq: Once | ORAL | Status: AC
  Administered 2020-08-14: 2 via ORAL
  Filled 2020-08-14: qty 2

## 2020-08-14 MED ORDER — AMOXICILLIN-POT CLAVULANATE 875-125 MG PO TABS
1.0000 | ORAL_TABLET | Freq: Two times a day (BID) | ORAL | 0 refills | Status: DC
Start: 2020-08-14 — End: 2021-04-23

## 2020-08-14 MED ORDER — HYDROCODONE-ACETAMINOPHEN 5-325 MG PO TABS: 2.0000 | ORAL_TABLET | ORAL | 0 refills | Status: DC | PRN

## 2020-08-14 NOTE — ED Notes (Signed)
MD at bedside. 

## 2020-08-14 NOTE — ED Provider Notes (Signed)
[facility]-EMERGENCY DEPT Provider Note   CSN: [id number] Arrival date & time: 08/14/20  1743     History Chief Complaint  Patient presents with  . Animal Bite    Patient's own dog    Kiara Mcfarland is a 25 y.o. female here with dog bite.  Patient states that she was anxious around her dog and her dog became upset and started attacking her.  Patient states that she was bitten multiple times in the left forearm as well as right leg.  Patient does not remember her last tetanus shot.  She states that this is her own dog and the dog is up-to-date with vaccines.   The history is provided by the patient.       Past Medical History:  Diagnosis Date  . Headache   . Menorrhagia 01/09/2012  . Sleep deprivation 02/17/2014    Patient Active Problem List   Diagnosis Date Noted  . MDD (major depressive disorder) 02/19/2019  . 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 Hallux valgus of left foot 03/10/2017  . Migraine without aura and without status migrainosus, not intractable 02/17/2014  . Episodic tension type headache 02/17/2014    Past Surgical History:  Procedure Laterality Date  . BUNIONECTOMY       OB History   No obstetric history on file.     Family History  Problem Relation Age of Onset  . Diabetes Maternal Grandmother   . Hypertension Maternal Grandmother   . Stroke Maternal Grandmother        Died at 42  . Diabetes Maternal Grandfather        Died at 57  . Hypertension Maternal Grandfather   . Migraines Father     Social History   Tobacco Use  . Smoking status: Passive Smoke Exposure - Never Smoker  . Smokeless tobacco: Never Used  Vaping Use  . Vaping Use: Never used  Substance Use Topics  . Alcohol use: No  . Drug use: No    Home Medications Prior to Admission medications   Medication Sig Start Date End Date Taking? Authorizing Provider  escitalopram (LEXAPRO) 10 MG tablet Take 1 tablet (10 mg total) by  mouth daily. Patient not taking: Reported on 01/24/2020 08/20/19   Doreene Eland, MD  ibuprofen (ADVIL,MOTRIN) 400 MG tablet take 1 tablet by mouth every 8 hours if needed Patient not taking: Reported on 08/28/2018 12/08/16   Doreene Eland, MD  norgestimate-ethinyl estradiol (MILI) 0.25-35 MG-MCG tablet Take 1 tablet by mouth daily. Patient not taking: Reported on 01/24/2020 07/30/19   Doreene Eland, MD    Allergies    Nickel  Review of Systems   Review of Systems  Skin: Positive for wound.  All other systems reviewed and are negative.   Physical Exam Updated Vital Signs BP 118/82   Pulse 87   Temp 99.2 F (37.3 C) (Oral)   Resp 16   Ht 5\' 2"  (1.575 m)   Wt 63.5 kg   LMP 08/14/2020   SpO2 100%   BMI 25.60 kg/m   Physical Exam Vitals and nursing note reviewed.  Constitutional:      Appearance: Normal appearance.     Comments: Anxious and crying  HENT:     Head: Normocephalic.     Nose: Nose normal.     Mouth/Throat:     Mouth: Mucous membranes are moist.  Eyes:     Pupils: Pupils are equal, round, and reactive  to light.  Cardiovascular:     Rate and Rhythm: Normal rate.     Pulses: Normal pulses.  Pulmonary:     Effort: Pulmonary effort is normal.     Breath sounds: Normal breath sounds.  Abdominal:     General: Abdomen is flat.     Palpations: Abdomen is soft.  Musculoskeletal:     Cervical back: Normal range of motion.     Comments: Patient has multiple wounds in the left arm some with fat exposed.  Patient also has multiple wounds in the right lower leg and thigh.  Please see picture  Skin:    General: Skin is warm.     Capillary Refill: Capillary refill takes less than 2 seconds.  Neurological:     General: No focal deficit present.     Mental Status: She is alert and oriented to person, place, and time.  Psychiatric:        Mood and Affect: Mood normal.        Behavior: Behavior normal.         ED Results / Procedures / Treatments    Labs (all labs ordered are listed, but only abnormal results are displayed) Labs Reviewed - No data to display  EKG None  Radiology No results found.  Procedures Procedures   LACERATION REPAIR Performed by: Richardean Canal Authorized by: Richardean Canal Consent: Verbal consent obtained. Risks and benefits: risks, benefits and alternatives were discussed Consent given by: patient Patient identity confirmed: provided demographic data Prepped and Draped in normal sterile fashion Wound explored  Laceration Location: R leg  Laceration Length: 2 cm x 6 wounds   No Foreign Bodies seen or palpated  Anesthesia: local infiltration  Local anesthetic: lidocaine 2 % no epinephrine  Anesthetic total: 10 ml  Irrigation method: syringe Amount of cleaning: standard  Skin closure: 4-0 ethilon  Number of sutures: 13  Technique: simple interrupted   Patient tolerance: Patient tolerated the procedure well with no immediate complications.  LACERATION REPAIR Performed by: Richardean Canal Authorized by: Richardean Canal Consent: Verbal consent obtained. Risks and benefits: risks, benefits and alternatives were discussed Consent given by: patient Patient identity confirmed: provided demographic data Prepped and Draped in normal sterile fashion Wound explored  Laceration Location: L forearm   Laceration Length: 2 cm x 2  No Foreign Bodies seen or palpated  Anesthesia: local infiltration  Local anesthetic: lidocaine 2% no epinephrine  Anesthetic total: 8 ml  Irrigation method: syringe Amount of cleaning: standard  Skin closure: 4-0 ethilon  Number of sutures: 2  Technique: simple interrupted   Patient tolerance: Patient tolerated the procedure well with no immediate complications.    Medications Ordered in ED Medications  Tdap (BOOSTRIX) injection 0.5 mL (has no administration in time range)  lidocaine (XYLOCAINE) 2 % (with pres) injection 400 mg (400 mg Other Given 08/14/20  1851)  amoxicillin-clavulanate (AUGMENTIN) 875-125 MG per tablet 1 tablet (1 tablet Oral Given 08/14/20 1850)  oxyCODONE-acetaminophen (PERCOCET/ROXICET) 5-325 MG per tablet 2 tablet (2 tablets Oral Given 08/14/20 1850)  LORazepam (ATIVAN) tablet 1 mg (1 mg Oral Given 08/14/20 1851)    ED Course  I have reviewed the triage vital signs and the nursing notes.  Pertinent labs & imaging results that were available during my care of the patient were reviewed by me and considered in my medical decision making (see chart for details).    MDM Rules/Calculators/A&P  Kiara Mcfarland is a 25 y.o. female here presenting with multiple wounds from dog bite.  His dog is up to date with shots.  She had her tetanus updated in the ED.  She has multiple wounds.  I was able to suture it large once that has fat exposed.  I extensively irrigated before hand.  I only loosely approximated.  She is very concerned for infection.  She was given Augmentin in the ED.  I will discharge her home with Augmentin.  I told her that she will need a wound check in 2 days and suture removal in a week.   '  Final Clinical Impression(s) / ED Diagnoses Final diagnoses:  None    Rx / DC Orders ED Discharge Orders    None       Charlynne Pander, MD 08/14/20 2025

## 2020-08-14 NOTE — Discharge Instructions (Signed)
Take Augmentin twice daily for a week.  Take Motrin for pain and Norco for severe pain  Need to return in 2 days for wound check and in a week for suture removal  See your doctor for follow-up  Return to ER if you have uncontrolled pain, purulent discharge, fever

## 2020-08-14 NOTE — ED Triage Notes (Signed)
Patient BIB family for multiple dog bites on right leg and  left arm. Patient's own dog attacked patient.

## 2020-08-16 ENCOUNTER — Other Ambulatory Visit: Payer: Self-pay

## 2020-08-16 ENCOUNTER — Encounter (HOSPITAL_COMMUNITY): Payer: Self-pay | Admitting: Emergency Medicine

## 2020-08-16 ENCOUNTER — Emergency Department (HOSPITAL_COMMUNITY)
Admission: EM | Admit: 2020-08-16 | Discharge: 2020-08-16 | Disposition: A | Discharge status: Home / Self Care | Payer: BC Managed Care – PPO | Attending: Emergency Medicine | Admitting: Emergency Medicine

## 2020-08-16 DIAGNOSIS — W540XXD Bitten by dog, subsequent encounter: Secondary | ICD-10-CM | POA: Diagnosis not present

## 2020-08-16 DIAGNOSIS — Z4801 Encounter for change or removal of surgical wound dressing: Secondary | ICD-10-CM | POA: Diagnosis not present

## 2020-08-16 DIAGNOSIS — T1490XD Injury, unspecified, subsequent encounter: Secondary | ICD-10-CM | POA: Diagnosis not present

## 2020-08-16 DIAGNOSIS — Z4800 Encounter for change or removal of nonsurgical wound dressing: Secondary | ICD-10-CM | POA: Insufficient documentation

## 2020-08-16 DIAGNOSIS — Z5189 Encounter for other specified aftercare: Secondary | ICD-10-CM

## 2020-08-16 NOTE — Discharge Instructions (Addendum)
Keep wounds clean and dry. May use a mild soap such as Dove on a q-tip to clean wounds. Change dressings daily. Finish your Augmentin. Follow up with your doctor for further wound care and suture removal.

## 2020-08-16 NOTE — ED Triage Notes (Signed)
Patient reports that she is here for a wound check. States that she had multiple puncture wounds from dog attack and was told to come back within 2 days for check.

## 2020-08-16 NOTE — ED Provider Notes (Signed)
[facility]   CSN: [id number] Arrival date & time: 08/16/20  1212     History Chief Complaint  Patient presents with  . Wound Check    Kiara Mcfarland is a 25 y.o. female.  25 year old female returns for wound check. Patient was seen in this ED 2 days ago after her dog bit her. Wounds were irrigated and gaping wounds approximated. Patient has been taking her Augmentin as prescribed, was instructed not to change her dressings until seen in the ER. No other complaints or concerns.         Past Medical History:  Diagnosis Date  . Headache   . Menorrhagia 01/09/2012  . Sleep deprivation 02/17/2014    Patient Active Problem List   Diagnosis Date Noted  . MDD (major depressive disorder) 02/19/2019  . 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 Hallux valgus of left foot 03/10/2017  . Migraine without aura and without status migrainosus, not intractable 02/17/2014  . Episodic tension type headache 02/17/2014    Past Surgical History:  Procedure Laterality Date  . BUNIONECTOMY       OB History   No obstetric history on file.     Family History  Problem Relation Age of Onset  . Diabetes Maternal Grandmother   . Hypertension Maternal Grandmother   . Stroke Maternal Grandmother        Died at 26  . Diabetes Maternal Grandfather        Died at 41  . Hypertension Maternal Grandfather   . Migraines Father     Social History   Tobacco Use  . Smoking status: Passive Smoke Exposure - Never Smoker  . Smokeless tobacco: Never Used  Vaping Use  . Vaping Use: Never used  Substance Use Topics  . Alcohol use: No  . Drug use: No    Home Medications Prior to Admission medications   Medication Sig Start Date End Date Taking? Authorizing Provider  amoxicillin-clavulanate (AUGMENTIN) 875-125 MG tablet Take 1 tablet by mouth 2 (two) times daily. One po bid x 7 days 08/14/20   Charlynne Pander, MD   escitalopram (LEXAPRO) 10 MG tablet Take 1 tablet (10 mg total) by mouth daily. Patient not taking: Reported on 01/24/2020 08/20/19   Doreene Eland, MD  HYDROcodone-acetaminophen (NORCO/VICODIN) 5-325 MG tablet Take 2 tablets by mouth every 4 (four) hours as needed. 08/14/20   Charlynne Pander, MD  ibuprofen (ADVIL,MOTRIN) 400 MG tablet take 1 tablet by mouth every 8 hours if needed Patient not taking: Reported on 08/28/2018 12/08/16   Doreene Eland, MD  norgestimate-ethinyl estradiol (MILI) 0.25-35 MG-MCG tablet Take 1 tablet by mouth daily. Patient not taking: Reported on 01/24/2020 07/30/19   Doreene Eland, MD    Allergies    Nickel  Review of Systems   Review of Systems  Constitutional: Negative for fever.  Musculoskeletal: Positive for myalgias.  Skin: Positive for wound.  Allergic/Immunologic: Negative for immunocompromised state.  Neurological: Negative for weakness and numbness.  Hematological: Negative for adenopathy.    Physical Exam Updated Vital Signs BP 114/71 (BP Location: Right Arm)   Pulse 88   Temp 98 F (36.7 C) (Oral)   Resp 19   LMP 08/14/2020   SpO2 100%   Physical Exam Vitals and nursing note reviewed.  Constitutional:      General: She is not in acute distress.    Appearance: She is well-developed. She is not  diaphoretic.  HENT:     Head: Normocephalic and atraumatic.  Cardiovascular:     Pulses: Normal pulses.  Pulmonary:     Effort: Pulmonary effort is normal.  Skin:    General: Skin is warm and dry.     Comments: Wounds examined and appear to be healing well, no evidence of infection at this time.  Neurological:     Mental Status: She is alert and oriented to person, place, and time.  Psychiatric:        Behavior: Behavior normal.     ED Results / Procedures / Treatments   Labs (all labs ordered are listed, but only abnormal results are displayed) Labs Reviewed - No data to display  EKG None  Radiology No results  found.  Procedures Procedures   Medications Ordered in ED Medications - No data to display  ED Course  I have reviewed the triage vital signs and the nursing notes.  Pertinent labs & imaging results that were available during my care of the patient were reviewed by me and considered in my medical decision making (see chart for details).  Clinical Course as of 08/16/20 1325  Sun Aug 16, 2020  [id number] 25 year old female with wound check from dog bite 2 days ago, reports animal control has followed up, dog is UTD with vaccines. Wounds appear to be healing well, taking Augmentin as prescribed. Recommend cover wounds if out an about, otherwise let wounds breath. Clean with mild soap/water if needed and change dressings daily. Recommend recheck with PCP Friday for further wound monitoring and suture removal with appropriate.  [LM]    Clinical Course User Index [LM] Alden Hipp   MDM Rules/Calculators/A&P                          Final Clinical Impression(s) / ED Diagnoses Final diagnoses:  Visit for wound check    Rx / DC Orders ED Discharge Orders    None       Alden Hipp 08/16/20 1325    Mancel Bale, MD 08/16/20 1726

## 2020-08-17 DIAGNOSIS — F411 Generalized anxiety disorder: Secondary | ICD-10-CM | POA: Diagnosis not present

## 2020-08-17 DIAGNOSIS — F329 Major depressive disorder, single episode, unspecified: Secondary | ICD-10-CM | POA: Diagnosis not present

## 2020-08-21 ENCOUNTER — Encounter: Payer: Self-pay | Admitting: Family Medicine

## 2020-08-21 ENCOUNTER — Other Ambulatory Visit: Payer: Self-pay

## 2020-08-21 ENCOUNTER — Ambulatory Visit (INDEPENDENT_AMBULATORY_CARE_PROVIDER_SITE_OTHER): Payer: BC Managed Care – PPO | Admitting: Family Medicine

## 2020-08-21 VITALS — BP 92/60 | HR 70 | Wt 136.0 lb

## 2020-08-21 DIAGNOSIS — Z5189 Encounter for other specified aftercare: Secondary | ICD-10-CM | POA: Diagnosis not present

## 2020-08-21 DIAGNOSIS — W540XXD Bitten by dog, subsequent encounter: Secondary | ICD-10-CM

## 2020-08-21 NOTE — Progress Notes (Signed)
    SUBJECTIVE:   CHIEF COMPLAINT / HPI:   Dog bite Patient recently bitten by dog multiple times at home on 5/20, went to the ED for treatment and had multiple sutures placed and was started on Augmentin.  She states that she came home and was upset and complaining about work to her mother, which is believed to have possibly seemed threatening to her mother's dog, which became upset and began attacking her.  She has multiple bites on her left forearm right leg and left leg.  Patient does not live with mother or with the dog, was able to be around the dog today without fear and had no issues.  Dog is working with a Psychologist, educational, mother states that if patient were to remain in for the dog or any other event were to happen they would figure out what to do with the dog.  PERTINENT  PMH / PSH: Reviewed  OBJECTIVE:   BP 92/60   Pulse 70   Wt 136 lb (61.7 kg)   LMP 08/14/2020   SpO2 100%   BMI 24.87 kg/m   General: NAD, well-appearing, well-nourished Respiratory: No respiratory distress, breathing comfortably, able to speak in full sentences Psych: Appropriate affect and mood Skin: multiple wounds with sutures in place healing well as pictured below  Right Medial calf   Right medial thigh   Right lateral calf   Right posterior thigh   Left arm   Left forearm    ASSESSMENT/PLAN:    Multiple dog bites Patient with multiple dog bites that required multiple sutures.  Several of the wounds are still in healing phase and are very large, discussed with Dr. Deretha Emory and decided that we should wait for 10 days to allow more healing to avoid opening of the wounds with suture removal.  Patient appointment scheduled for 5/31 for suture removals and likely Steri-Strip placement. -Suture removal on 5/31 -Return precautions given -Continue Augmentin for the full course   Debbie Yearick, DO [facility]

## 2020-08-21 NOTE — Patient Instructions (Addendum)
I think we need to wait a few more days to have the stitches removed, everything looks like it is healing well but I want to make sure that the larger wounds are more healed before removing the stitches. Make sure not to put vasline or anything on the area the day of the removal. We will call you once we are able to set up the appointment to have them removed.   Go the urgent care or ED over the weekend if you become feverish, the wounds become red, swollen, or painful.

## 2020-08-25 ENCOUNTER — Other Ambulatory Visit: Payer: Self-pay

## 2020-08-25 ENCOUNTER — Encounter: Payer: Self-pay | Admitting: Family Medicine

## 2020-08-25 ENCOUNTER — Ambulatory Visit (INDEPENDENT_AMBULATORY_CARE_PROVIDER_SITE_OTHER): Payer: BC Managed Care – PPO | Admitting: Family Medicine

## 2020-08-25 VITALS — BP 120/80 | HR 63 | Ht 62.0 in | Wt 135.0 lb

## 2020-08-25 DIAGNOSIS — F411 Generalized anxiety disorder: Secondary | ICD-10-CM | POA: Diagnosis not present

## 2020-08-25 DIAGNOSIS — Z4802 Encounter for removal of sutures: Secondary | ICD-10-CM

## 2020-08-25 DIAGNOSIS — F329 Major depressive disorder, single episode, unspecified: Secondary | ICD-10-CM | POA: Diagnosis not present

## 2020-08-25 NOTE — Progress Notes (Signed)
    SUBJECTIVE:   CHIEF COMPLAINT / HPI:   Suture Removal Patient here for suture removal. She was bitten by a canine multiple times about 10 days ago, which required closure with 13 sutures. Mechanism of injury: dog bite. She denies pain, redness, or drainage from the wound.  Patient finished course of Augmentin with no signs of infection. Removal completed with assistance from Dr. Darrol Angel. Sutures removed with sterile suture removal kit.   PERTINENT  PMH / PSH: Reviewed  OBJECTIVE:   BP 120/80   Pulse 63   Ht $R'5\' 2"'KU$  (1.575 m)   Wt 135 lb (61.2 kg)   LMP 08/14/2020   SpO2 99%   BMI 24.69 kg/m   General: NAD, well-appearing, well-nourished Respiratory: No respiratory distress, breathing comfortably, able to speak in full sentences Psych: Appropriate affect and mood  Injury exam:  Multiple lacerations on left upper extremity, right and left lower extremities are healing well, with evidence of infection   Assessment and Plan     Bite wounds are healing well, with evidence of infection.    1. 13 sutures were removed. 2. Wound care discussed. 3. Follow up as needed.

## 2020-08-25 NOTE — Patient Instructions (Addendum)
We removed your stitches today, if you notice any swelling, pain, redness, or fevers then make sure to get evaluated for infection. Keep gently cleaning the areas.     Suture Removal, Care After This sheet gives you information about how to care for yourself after your procedure. Your health care provider may also give you more specific instructions. If you have problems or questions, contact your health care provider. What can I expect after the procedure? After your stitches (sutures) are removed, it is common to have:  Some discomfort and swelling in the area.  Slight redness in the area. Follow these instructions at home: If you have a bandage:  Wash your hands with soap and water before you change your bandage (dressing). If soap and water are not available, use hand sanitizer.  Change your dressing as told by your health care provider. If your dressing becomes wet or dirty, or develops a bad smell, change it as soon as possible.  If your dressing sticks to your skin, soak it in warm water to loosen it. Wound care  Check your wound every day for signs of infection. Check for: ? More redness, swelling, or pain. ? Fluid or blood. ? Warmth. ? Pus or a bad smell.  Wash your hands with soap and water before and after touching your wound.  Apply cream or ointment only as directed by your health care provider. If you are using cream or ointment, wash the area with soap and water 2 times a day to remove all the cream or ointment. Rinse off the soap and pat the area dry with a clean towel.  If you have skin glue or adhesive strips on your wound, leave these closures in place. They may need to stay in place for 2 weeks or longer. If adhesive strip edges start to loosen and curl up, you may trim the loose edges. Do not remove adhesive strips completely unless your health care provider tells you to do that.  Keep the wound area dry and clean. Do not take baths, swim, or use a hot tub  until your health care provider approves.  Continue to protect the wound from injury.  Do not pick at your wound. Picking can cause an infection.  When your wound has completely healed, wear sunscreen over it or cover it with clothing when you are outside. New scars get sunburned easily, which can make scarring worse.   General instructions  Take over-the-counter and prescription medicines only as told by your health care provider.  Keep all follow-up visits as told by your health care provider. This is important. Contact a health care provider if:  You have redness, swelling, or pain around your wound.  You have fluid or blood coming from your wound.  Your wound feels warm to the touch.  You have pus or a bad smell coming from your wound.  Your wound opens up. Get help right away if:  You have a fever.  You have redness that is spreading from your wound. Summary  After your sutures are removed, it is common to have some discomfort and swelling in the area.  Wash your hands with soap and water before you change your bandage (dressing).  Keep the wound area dry and clean. Do not take baths, swim, or use a hot tub until your health care provider approves. This information is not intended to replace advice given to you by your health care provider. Make sure you discuss any questions you have  with your health care provider. Document Revised: 01/10/2020 Document Reviewed: 01/10/2020 Elsevier Patient Education  2021 [employer].

## 2020-08-31 DIAGNOSIS — F329 Major depressive disorder, single episode, unspecified: Secondary | ICD-10-CM | POA: Diagnosis not present

## 2020-08-31 DIAGNOSIS — F411 Generalized anxiety disorder: Secondary | ICD-10-CM | POA: Diagnosis not present

## 2020-09-21 DIAGNOSIS — F329 Major depressive disorder, single episode, unspecified: Secondary | ICD-10-CM | POA: Diagnosis not present

## 2020-09-21 DIAGNOSIS — F411 Generalized anxiety disorder: Secondary | ICD-10-CM | POA: Diagnosis not present

## 2020-10-05 DIAGNOSIS — F411 Generalized anxiety disorder: Secondary | ICD-10-CM | POA: Diagnosis not present

## 2020-10-05 DIAGNOSIS — F329 Major depressive disorder, single episode, unspecified: Secondary | ICD-10-CM | POA: Diagnosis not present

## 2020-11-23 DIAGNOSIS — F411 Generalized anxiety disorder: Secondary | ICD-10-CM | POA: Diagnosis not present

## 2020-11-23 DIAGNOSIS — F329 Major depressive disorder, single episode, unspecified: Secondary | ICD-10-CM | POA: Diagnosis not present

## 2020-12-11 DIAGNOSIS — F411 Generalized anxiety disorder: Secondary | ICD-10-CM | POA: Diagnosis not present

## 2020-12-11 DIAGNOSIS — F329 Major depressive disorder, single episode, unspecified: Secondary | ICD-10-CM | POA: Diagnosis not present

## 2021-02-03 ENCOUNTER — Ambulatory Visit (INDEPENDENT_AMBULATORY_CARE_PROVIDER_SITE_OTHER)

## 2021-02-03 ENCOUNTER — Ambulatory Visit (INDEPENDENT_AMBULATORY_CARE_PROVIDER_SITE_OTHER): Admitting: Podiatry

## 2021-02-03 ENCOUNTER — Other Ambulatory Visit: Payer: Self-pay

## 2021-02-03 DIAGNOSIS — M2011 Hallux valgus (acquired), right foot: Secondary | ICD-10-CM | POA: Diagnosis not present

## 2021-02-03 DIAGNOSIS — M2012 Hallux valgus (acquired), left foot: Secondary | ICD-10-CM

## 2021-02-03 NOTE — Progress Notes (Signed)
   Subjective: 25 y.o. female presents today for evalutation of a symptomatic bunion to the right foot.  Patient has history of Lapidus bunionectomy surgery 08/10/2017 to the left foot.  Patient states that her left foot is doing very well.  She is very satisfied with the results of her bunion surgery.  Today she would like to discuss pursuing right foot surgery to correct for her right foot bunion.   Past Medical History:  Diagnosis Date   Headache    Menorrhagia 01/09/2012   Sleep deprivation 02/17/2014      Objective: Physical Exam General: The patient is alert and oriented x3 in no acute distress.  Dermatology: Skin is cool, dry and supple bilateral lower extremities. Negative for open lesions or macerations.  Vascular: Palpable pedal pulses bilaterally. No edema or erythema noted. Capillary refill within normal limits.  Neurological: Epicritic and protective threshold grossly intact bilaterally.   Musculoskeletal Exam: Clinical evidence of bunion deformity noted to the respective foot. There is moderate pain on palpation range of motion of the first MPJ. Lateral deviation of the hallux noted consistent with hallux abductovalgus.  Radiographic Exam: Increased intermetatarsal angle greater than 15 with a hallux abductus angle greater than 30 noted on AP view. Moderate degenerative changes noted within the first MPJ.  Assessment: 1. HAV w/ bunion deformity right 2. H/o Lapidus bunionectomy left.  DOS: 08/10/2017   Plan of Care:  1. Patient was evaluated. X-Rays reviewed. 2. Today we discussed the conservative versus surgical management of the presenting pathology. The patient opts for surgical management. All possible complications and details of the procedure were explained. All patient questions were answered. No guarantees were expressed or implied. 3. Authorization for surgery was initiated today. Surgery will consist of Lapidus bunionectomy right foot 4.  Return to clinic 1  week postop    Felecia Shelling, DPM [facility]  Dr. Felecia Shelling, DPM    [address]                                        [place], [place] [address]                Office [phone]  Fax [phone]

## 2021-04-02 ENCOUNTER — Telehealth: Payer: Self-pay | Admitting: Family Medicine

## 2021-04-02 NOTE — Telephone Encounter (Addendum)
Hello Blue Team CMA,  Please contact patient and advise her that she need appointment for her surgical clearance form to be completed. Thanks.

## 2021-04-06 NOTE — Telephone Encounter (Signed)
Spoke with patient. Made appt for 1/17. Salvatore Marvel, CMA

## 2021-04-07 ENCOUNTER — Encounter: Admitting: Podiatry

## 2021-04-13 ENCOUNTER — Encounter: Payer: BC Managed Care – PPO | Admitting: Family Medicine

## 2021-04-13 ENCOUNTER — Telehealth: Payer: Self-pay | Admitting: Family Medicine

## 2021-04-13 NOTE — Telephone Encounter (Addendum)
She missed her appointment today 04/13/21. She was apologetic as she forgot about the appointment. She already rescheduled. Our no-show policy discussed with her and I will also send her a letter to remind her of the policy. She verbalized understanding.   Copy of letter printed and was handed over to Vea to mail as certified letter.

## 2021-04-14 ENCOUNTER — Encounter: Admitting: Podiatry

## 2021-04-23 ENCOUNTER — Encounter (HOSPITAL_BASED_OUTPATIENT_CLINIC_OR_DEPARTMENT_OTHER): Payer: Self-pay | Admitting: Podiatry

## 2021-04-26 ENCOUNTER — Encounter (HOSPITAL_BASED_OUTPATIENT_CLINIC_OR_DEPARTMENT_OTHER): Payer: Self-pay | Admitting: Podiatry

## 2021-04-26 ENCOUNTER — Other Ambulatory Visit: Payer: Self-pay

## 2021-04-26 NOTE — Progress Notes (Addendum)
ADDENDUM:  pt's pcp , Dr Clide Deutscher, H&P dated 04-29-2021 scanned in epic under progress note, printed put in chart.   Spoke w/ via phone for pre-op interview--- pt Lab needs dos----  urine preg (per anes)             Lab results------ no COVID test -----patient states asymptomatic no test needed Arrive at ------- 1100 on 04-30-2021 NPO after MN NO Solid Food.  Clear liquids from MN until--- 1000 Med rec completed Medications to take morning of surgery ----- none Diabetic medication ----- n/a Patient instructed no nail polish to be worn day of surgery Patient instructed to bring photo id and insurance card day of surgery Patient aware to have Driver (ride ) / caregiver for 24 hours after surgery --mother, jacqueline Patient Special Instructions ----- n/a Pre-Op special Istructions ----- sent inbox message in epic dr Logan Bores, requested orders. Have not received pt's pcp H&P yet from dr Logan Bores office. Patient verbalized understanding of instructions that were given at this phone interview. Patient denies shortness of breath, chest pain, fever, cough at this phone interview.

## 2021-04-28 ENCOUNTER — Other Ambulatory Visit: Payer: Self-pay

## 2021-04-28 ENCOUNTER — Ambulatory Visit (INDEPENDENT_AMBULATORY_CARE_PROVIDER_SITE_OTHER): Payer: BC Managed Care – PPO | Admitting: Family Medicine

## 2021-04-28 ENCOUNTER — Encounter: Admitting: Podiatry

## 2021-04-28 ENCOUNTER — Encounter: Payer: Self-pay | Admitting: Family Medicine

## 2021-04-28 VITALS — BP 107/55 | HR 66 | Ht 62.0 in | Wt 140.0 lb

## 2021-04-28 DIAGNOSIS — Z0000 Encounter for general adult medical examination without abnormal findings: Secondary | ICD-10-CM

## 2021-04-28 DIAGNOSIS — Z13228 Encounter for screening for other metabolic disorders: Secondary | ICD-10-CM

## 2021-04-28 DIAGNOSIS — F320 Major depressive disorder, single episode, mild: Secondary | ICD-10-CM | POA: Diagnosis not present

## 2021-04-28 NOTE — Progress Notes (Signed)
Subjective:     Kiara Mcfarland is a 26 y.o. female and is here for a comprehensive physical exam. The patient reports no problems. She is scheduled for foot surgery in two days and will like to be evaluated prior for surgical clearance. This is going to be under general anesthesia. She is an occasional alcohol drinker and does not smoke LMP: 04/12/21 - regular. She is sexually active, uses condoms regularly and does not want to be on hormonal contraceptive. She self d/ced her antidepressant more than 6 months ago and she is been doing well. No SI.  Social History   Socioeconomic History   Marital status: Single    Spouse name: Not on file   Number of children: Not on file   Years of education: Not on file   Highest education level: Not on file  Occupational History   Not on file  Tobacco Use   Smoking status: Never   Smokeless tobacco: Never  Vaping Use   Vaping Use: Never used  Substance and Sexual Activity   Alcohol use: Yes    Comment: occasional   Drug use: Never   Sexual activity: Not on file  Other Topics Concern   Not on file  Social History Narrative   Lives with mom and her brother (one year younger)   Social Determinants of Health   Financial Resource Strain: Not on file  Food Insecurity: Not on file  Transportation Needs: Not on file  Physical Activity: Not on file  Stress: Not on file  Social Connections: Not on file  Intimate Partner Violence: Not on file   Health Maintenance  Topic Date Due   COVID-19 Vaccine (3 - Booster for Moderna series) 05/14/2021 (Originally 01/22/2020)   INFLUENZA VACCINE  06/25/2021 (Originally 10/26/2020)   PAP SMEAR-Modifier  01/24/2023   TETANUS/TDAP  08/15/2030   HPV VACCINES  Completed   Hepatitis C Screening  Completed   HIV Screening  Completed   PAP-Cervical Cytology Screening  Discontinued    The following portions of the patient's history were reviewed and updated as appropriate: allergies, current medications,  past family history, past medical history, past social history, past surgical history, and problem list.  Review of Systems Pertinent items are noted in HPI. Pertinent items noted in HPI and remainder of comprehensive ROS otherwise negative.   Objective:    BP (!) 107/55    Pulse 66    Ht 5\' 2"  (1.575 m)    Wt 140 lb (63.5 kg)    LMP 04/12/2021 (Approximate)    SpO2 100%    BMI 25.61 kg/m  General appearance: alert, cooperative, and appears stated age Head: Normocephalic, without obvious abnormality, atraumatic Eyes: conjunctivae/corneas clear. PERRL, EOM's intact. Fundi benign. Ears: B/L cerumen impaction, otherwise, normal ear canals. Throat: lips, mucosa, and tongue normal; teeth and gums normal Neck: supple, symmetrical, trachea midline Lungs: clear to auscultation bilaterally Heart: regular rate and rhythm, S1, S2 normal, no murmur, click, rub or gallop Abdomen: soft, non-tender; bowel sounds normal; no masses,  no organomegaly Extremities: extremities normal, atraumatic, no cyanosis or edema Neurologic: Alert and oriented X 3, normal strength and tone. Normal symmetric reflexes. Normal coordination and gait    Assessment:    Healthy female exam.       Plan:    Normal exam She is up to date with PAP exam. She declined flu and covid 19 shots. CBC and Bmet checked for surgical clearance. I will complete her form as son as her  result is available. See After Visit Summary for Counseling Recommendations

## 2021-04-28 NOTE — Assessment & Plan Note (Signed)
Stable off med, which she self d/ced

## 2021-04-28 NOTE — Patient Instructions (Signed)
It was nice seeing you today. Your physical exam looks good. I will contact you soon with lab results and complete your surgical clearance depending on test results.

## 2021-04-29 ENCOUNTER — Encounter: Payer: Self-pay | Admitting: Family Medicine

## 2021-04-29 LAB — BASIC METABOLIC PANEL
BUN/Creatinine Ratio: 21 (ref 9–23)
BUN: 19 mg/dL (ref 6–20)
CO2: 22 mmol/L (ref 20–29)
Calcium: 9.6 mg/dL (ref 8.7–10.2)
Chloride: 103 mmol/L (ref 96–106)
Creatinine, Ser: 0.9 mg/dL (ref 0.57–1.00)
Glucose: 92 mg/dL (ref 70–99)
Potassium: 5.1 mmol/L (ref 3.5–5.2)
Sodium: 137 mmol/L (ref 134–144)
eGFR: 92 mL/min/{1.73_m2} (ref >59)

## 2021-04-29 LAB — CBC
Hematocrit: 36.2 % (ref 34.0–46.6)
Hemoglobin: 11.6 g/dL (ref 11.1–15.9)
MCH: 26.2 pg — ABNORMAL LOW (ref 26.6–33.0)
MCHC: 32 g/dL (ref 31.5–35.7)
MCV: 82 fL (ref 79–97)
Platelets: 292 10*3/uL (ref 150–450)
RBC: 4.43 x10E6/uL (ref 3.77–5.28)
RDW: 14 % (ref 11.7–15.4)
WBC: 9.1 10*3/uL (ref 3.4–10.8)

## 2021-04-30 ENCOUNTER — Other Ambulatory Visit: Payer: Self-pay | Admitting: Sports Medicine

## 2021-04-30 ENCOUNTER — Other Ambulatory Visit: Payer: Self-pay

## 2021-04-30 ENCOUNTER — Encounter: Payer: Self-pay | Admitting: Podiatry

## 2021-04-30 ENCOUNTER — Ambulatory Visit (HOSPITAL_BASED_OUTPATIENT_CLINIC_OR_DEPARTMENT_OTHER): Payer: BC Managed Care – PPO | Admitting: Anesthesiology

## 2021-04-30 ENCOUNTER — Ambulatory Visit (HOSPITAL_BASED_OUTPATIENT_CLINIC_OR_DEPARTMENT_OTHER)
Admission: RE | Admit: 2021-04-30 | Discharge: 2021-04-30 | Disposition: A | Discharge status: Home / Self Care | Payer: BC Managed Care – PPO | Attending: Podiatry | Admitting: Podiatry

## 2021-04-30 ENCOUNTER — Encounter (HOSPITAL_BASED_OUTPATIENT_CLINIC_OR_DEPARTMENT_OTHER): Payer: Self-pay | Admitting: Podiatry

## 2021-04-30 ENCOUNTER — Ambulatory Visit (HOSPITAL_BASED_OUTPATIENT_CLINIC_OR_DEPARTMENT_OTHER): Payer: BC Managed Care – PPO

## 2021-04-30 ENCOUNTER — Encounter (HOSPITAL_BASED_OUTPATIENT_CLINIC_OR_DEPARTMENT_OTHER)
Admission: RE | Disposition: A | Discharge status: Home / Self Care | Payer: Self-pay | Source: Home / Self Care | Attending: Podiatry

## 2021-04-30 DIAGNOSIS — M21611 Bunion of right foot: Secondary | ICD-10-CM | POA: Insufficient documentation

## 2021-04-30 DIAGNOSIS — F32A Depression, unspecified: Secondary | ICD-10-CM | POA: Insufficient documentation

## 2021-04-30 DIAGNOSIS — M2011 Hallux valgus (acquired), right foot: Secondary | ICD-10-CM | POA: Diagnosis not present

## 2021-04-30 HISTORY — PX: HALLUX VALGUS LAPIDUS: SHX6626

## 2021-04-30 LAB — POCT PREGNANCY, URINE: Preg Test, Ur: NEGATIVE

## 2021-04-30 SURGERY — BUNIONECTOMY, LAPIDUS
Anesthesia: Monitor Anesthesia Care | Site: Foot | Laterality: Right

## 2021-04-30 MED ORDER — KETAMINE HCL 10 MG/ML IJ SOLN
INTRAMUSCULAR | Status: DC | PRN
  Administered 2021-04-30: 20 mg via INTRAVENOUS

## 2021-04-30 MED ORDER — ACETAMINOPHEN 10 MG/ML IV SOLN
INTRAVENOUS | Status: AC
  Filled 2021-04-30: qty 100

## 2021-04-30 MED ORDER — PROPOFOL 10 MG/ML IV BOLUS
INTRAVENOUS | Status: AC
  Filled 2021-04-30: qty 20

## 2021-04-30 MED ORDER — FENTANYL CITRATE (PF) 100 MCG/2ML IJ SOLN
INTRAMUSCULAR | Status: DC | PRN
Start: 2021-04-30 — End: 2021-04-30
  Administered 2021-04-30: 50 ug via INTRAVENOUS

## 2021-04-30 MED ORDER — ACETAMINOPHEN 10 MG/ML IV SOLN
1000.0000 mg | Freq: Once | INTRAVENOUS | Status: DC | PRN
  Administered 2021-04-30: 1000 mg via INTRAVENOUS

## 2021-04-30 MED ORDER — MIDAZOLAM HCL 5 MG/5ML IJ SOLN
INTRAMUSCULAR | Status: DC | PRN
  Administered 2021-04-30: 2 mg via INTRAVENOUS

## 2021-04-30 MED ORDER — MIDAZOLAM HCL 2 MG/2ML IJ SOLN
2.0000 mg | Freq: Once | INTRAMUSCULAR | Status: AC
  Administered 2021-04-30: 2 mg via INTRAVENOUS

## 2021-04-30 MED ORDER — ONDANSETRON HCL 4 MG/2ML IJ SOLN
INTRAMUSCULAR | Status: AC
  Filled 2021-04-30: qty 2

## 2021-04-30 MED ORDER — PROPOFOL 500 MG/50ML IV EMUL
INTRAVENOUS | Status: DC | PRN
  Administered 2021-04-30: 200 ug/kg/min via INTRAVENOUS

## 2021-04-30 MED ORDER — ONDANSETRON HCL 4 MG/2ML IJ SOLN
INTRAMUSCULAR | Status: DC | PRN
  Administered 2021-04-30: 4 mg via INTRAVENOUS

## 2021-04-30 MED ORDER — PROPOFOL 10 MG/ML IV BOLUS
INTRAVENOUS | Status: DC | PRN
Start: 2021-04-30 — End: 2021-04-30
  Administered 2021-04-30: 40 mg via INTRAVENOUS

## 2021-04-30 MED ORDER — FENTANYL CITRATE (PF) 100 MCG/2ML IJ SOLN
INTRAMUSCULAR | Status: AC
  Filled 2021-04-30: qty 2

## 2021-04-30 MED ORDER — CEFAZOLIN SODIUM-DEXTROSE 2-4 GM/100ML-% IV SOLN
2.0000 g | INTRAVENOUS | Status: AC
  Administered 2021-04-30: 2 g via INTRAVENOUS

## 2021-04-30 MED ORDER — MIDAZOLAM HCL 2 MG/2ML IJ SOLN
INTRAMUSCULAR | Status: AC
  Filled 2021-04-30: qty 2

## 2021-04-30 MED ORDER — OXYCODONE-ACETAMINOPHEN 5-325 MG PO TABS
1.0000 | ORAL_TABLET | ORAL | 0 refills | Status: DC | PRN
Start: 2021-04-30 — End: 2021-04-30

## 2021-04-30 MED ORDER — KETAMINE HCL 50 MG/5ML IJ SOSY
PREFILLED_SYRINGE | INTRAMUSCULAR | Status: AC
  Filled 2021-04-30: qty 5

## 2021-04-30 MED ORDER — DICLOFENAC SODIUM 75 MG PO TBEC: 75.0000 mg | DELAYED_RELEASE_TABLET | Freq: Two times a day (BID) | ORAL | 1 refills | Status: DC

## 2021-04-30 MED ORDER — PROPOFOL 1000 MG/100ML IV EMUL
INTRAVENOUS | Status: AC
  Filled 2021-04-30: qty 200

## 2021-04-30 MED ORDER — PROMETHAZINE HCL 25 MG/ML IJ SOLN: 6.2500 mg | INTRAMUSCULAR | Status: DC | PRN

## 2021-04-30 MED ORDER — OXYCODONE-ACETAMINOPHEN 5-325 MG PO TABS: 1.0000 | ORAL_TABLET | ORAL | 0 refills | Status: AC | PRN

## 2021-04-30 MED ORDER — FENTANYL CITRATE (PF) 100 MCG/2ML IJ SOLN
50.0000 ug | Freq: Once | INTRAMUSCULAR | Status: AC
  Administered 2021-04-30: 50 ug via INTRAVENOUS

## 2021-04-30 MED ORDER — SODIUM CHLORIDE 0.9 % IR SOLN
Status: DC | PRN
Start: 2021-04-30 — End: 2021-04-30
  Administered 2021-04-30: 500 mL

## 2021-04-30 MED ORDER — ROPIVACAINE HCL 5 MG/ML IJ SOLN
INTRAMUSCULAR | Status: DC | PRN
  Administered 2021-04-30: 25 mL via PERINEURAL

## 2021-04-30 MED ORDER — CLONIDINE HCL (ANALGESIA) 100 MCG/ML EP SOLN
EPIDURAL | Status: DC | PRN
  Administered 2021-04-30: 100 ug

## 2021-04-30 MED ORDER — LACTATED RINGERS IV SOLN: INTRAVENOUS | Status: DC

## 2021-04-30 MED ORDER — CEFAZOLIN SODIUM-DEXTROSE 2-4 GM/100ML-% IV SOLN
INTRAVENOUS | Status: AC
  Filled 2021-04-30: qty 100

## 2021-04-30 MED ORDER — FENTANYL CITRATE (PF) 100 MCG/2ML IJ SOLN
25.0000 ug | INTRAMUSCULAR | Status: DC | PRN
  Administered 2021-04-30: 50 ug via INTRAVENOUS

## 2021-04-30 SURGICAL SUPPLY — 60 items
APL PRP STRL LF DISP 70% ISPRP (MISCELLANEOUS) ×1
BLADE AVERAGE 25X9 (BLADE) ×1 IMPLANT
BLADE SAW LAPIPLASTY 40X11 (BLADE) ×1 IMPLANT
BLADE SURG 15 STRL LF DISP TIS (BLADE) ×2 IMPLANT
BLADE SURG 15 STRL SS (BLADE) ×4
BNDG CMPR 9X4 STRL LF SNTH (GAUZE/BANDAGES/DRESSINGS) ×1
BNDG ELASTIC 4X5.8 VLCR STR LF (GAUZE/BANDAGES/DRESSINGS) ×2 IMPLANT
BNDG ESMARK 4X9 LF (GAUZE/BANDAGES/DRESSINGS) ×2 IMPLANT
BNDG GAUZE ELAST 4 BULKY (GAUZE/BANDAGES/DRESSINGS) ×2 IMPLANT
CHLORAPREP W/TINT 26 (MISCELLANEOUS) ×2 IMPLANT
COVER BACK TABLE 60X90IN (DRAPES) ×2 IMPLANT
CUFF TOURN SGL QUICK 18X4 (TOURNIQUET CUFF) ×2 IMPLANT
DRAPE EXTREMITY T 121X128X90 (DISPOSABLE) ×2 IMPLANT
DRAPE OEC MINIVIEW 54X84 (DRAPES) ×2 IMPLANT
DRAPE SHEET LG 3/4 BI-LAMINATE (DRAPES) ×2 IMPLANT
ELECT REM PT RETURN 9FT ADLT (ELECTROSURGICAL) ×2
ELECTRODE REM PT RTRN 9FT ADLT (ELECTROSURGICAL) ×1 IMPLANT
GAUZE 4X4 16PLY ~~LOC~~+RFID DBL (SPONGE) ×2 IMPLANT
GAUZE SPONGE 4X4 12PLY STRL (GAUZE/BANDAGES/DRESSINGS) ×2 IMPLANT
GAUZE SPONGE 4X4 12PLY STRL LF (GAUZE/BANDAGES/DRESSINGS) ×1 IMPLANT
GAUZE XEROFORM 1X8 LF (GAUZE/BANDAGES/DRESSINGS) ×2 IMPLANT
GLOVE SRG 8 PF TXTR STRL LF DI (GLOVE) ×1 IMPLANT
GLOVE SURG ENC MOIS LTX SZ8 (GLOVE) ×2 IMPLANT
GLOVE SURG UNDER POLY LF SZ8 (GLOVE) ×2
GOWN STRL REUS W/TWL XL LVL3 (GOWN DISPOSABLE) ×2 IMPLANT
INST GUIDED SPEEDRELEASE (INSTRUMENTS) ×2
INSTRUMENT GUIDED SPEEDRELEASE (INSTRUMENTS) IMPLANT
K-WIRE CAPS STERILE WHITE .045 (WIRE) IMPLANT
K-WIRE DBL END TROCAR 6X.045 (WIRE)
K-WIRE DBL END TROCAR 6X.062 (WIRE)
KIT TURNOVER CYSTO (KITS) ×2 IMPLANT
KWIRE DBL END TROCAR 6X.045 (WIRE) IMPLANT
KWIRE DBL END TROCAR 6X.062 (WIRE) IMPLANT
NDL HYPO 25X1 1.5 SAFETY (NEEDLE) IMPLANT
NEEDLE HYPO 25X1 1.5 SAFETY (NEEDLE) IMPLANT
NS IRRIG 1000ML POUR BTL (IV SOLUTION) IMPLANT
PACK BASIN DAY SURGERY FS (CUSTOM PROCEDURE TRAY) ×2 IMPLANT
PAD CAST 4YDX4 CTTN HI CHSV (CAST SUPPLIES) IMPLANT
PADDING CAST ABS 4INX4YD NS (CAST SUPPLIES) ×2
PADDING CAST ABS COTTON 4X4 ST (CAST SUPPLIES) ×2 IMPLANT
PADDING CAST COTTON 4X4 STRL (CAST SUPPLIES) ×2
PENCIL SMOKE EVACUATOR (MISCELLANEOUS) ×2 IMPLANT
PIN CAPS ORTHO GREEN .062 (PIN) IMPLANT
SCREW 2.7 HIGH PITCH LOCKING (Screw) ×2 IMPLANT
STAPLER VISISTAT (STAPLE) IMPLANT
STOCKINETTE 6  STRL (DRAPES) ×2
STOCKINETTE 6 STRL (DRAPES) ×1 IMPLANT
SUCTION FRAZIER HANDLE 10FR (MISCELLANEOUS) ×2
SUCTION TUBE FRAZIER 10FR DISP (MISCELLANEOUS) ×1 IMPLANT
SUT MNCRL AB 3-0 PS2 18 (SUTURE) IMPLANT
SUT MNCRL AB 4-0 PS2 18 (SUTURE) IMPLANT
SUT MON AB 5-0 PS2 18 (SUTURE) IMPLANT
SUT PROLENE 4 0 PS 2 18 (SUTURE) ×2 IMPLANT
SUT VIC AB 4-0 PS2 27 (SUTURE) ×2 IMPLANT
SYR BULB EAR ULCER 3OZ GRN STR (SYRINGE) ×2 IMPLANT
SYR CONTROL 10ML LL (SYRINGE) IMPLANT
SYSTEM LAPIPLASTY MINI INCIS (Orthopedic Implant) ×1 IMPLANT
TOWEL OR 17X26 10 PK STRL BLUE (TOWEL DISPOSABLE) ×2 IMPLANT
TUBE CONNECTING 12X1/4 (SUCTIONS) ×2 IMPLANT
UNDERPAD 30X36 HEAVY ABSORB (UNDERPADS AND DIAPERS) ×2 IMPLANT

## 2021-04-30 NOTE — Interval H&P Note (Signed)
History and Physical Interval Note:  04/30/2021 1:06 PM  Kiara Mcfarland  has presented today for surgery, with the diagnosis of BUNION RIGHT FOOT.  The various methods of treatment have been discussed with the patient and family. After consideration of risks, benefits and other options for treatment, the patient has consented to  Procedure(s): HALLUX VALGUS LAPIDUS (Right) as a surgical intervention.  The patient's history has been reviewed, patient examined, no change in status, stable for surgery.  I have reviewed the patient's chart and labs.  Questions were answered to the patient's satisfaction.     Felecia Shelling

## 2021-04-30 NOTE — Op Note (Signed)
OPERATIVE REPORT Patient name: Kiara Mcfarland MRN: [id number] DOB: 03/23/1996  DOS: 04/30/21  Preop Dx: Postop Dx: same  Procedure:  1. Lapiplasty type bunionectomy right  Surgeon: Edrick Kins DPM  Anesthesia: General anesthesia with regional block performed in preop by anesthesia  Hemostasis: Calf tourniquet inflated to a pressure of 240mmHg after esmarch exsanguination   EBL: Minimal mL Materials: [employer] medical Lapiplasty plates x2 with respective screws Injectables: None Pathology: None  Condition: The patient tolerated the procedure and anesthesia well. No complications noted or reported   Justification for procedure: The patient is a 26 y.o. female who presents today for surgical correction of symptomatic bunion to the right foot. All conservative modalities of been unsuccessful in providing any sort of satisfactory alleviation of symptoms with the patient. The patient was told benefits as well as possible side effects of the surgery. The patient consented for surgical correction. The patient consent form was reviewed. All patient questions were answered. No guarantees were expressed or implied. The patient and the surgeon both signed the patient consent form with the witness present and placed in the patient's chart.   Procedure in Detail: The patient was brought to the operating room, placed in the operating table in the supine position at which time an aseptic scrub and drape were performed about the patient's respective lower extremity after anesthesia was induced as described above. Attention was then directed to the surgical area where procedure number one commenced.  Procedure #1: Lapidus bunionectomy right Attention was directed to the dorsal 1st metatarsal cuneiform joint of the right foot. Dorsal medial incision was made over the first TMT joint from proximal to distal. The incision was deepened using both blunt and sharp dissection being sure that all  neurovascular structures were either ligated, bovied, or retracted as necessary. Capsular incision was made in the same orientation and length as the surgical incision. The capsular tissues and periosteum were then reflected and all soft tissue supportive structures of the 1st TMT joint transected allowing adequate rotation of the 1st metatarsal. With the 1st TMT reduced to normal anatomical position, parallel cuts were made of the distal medial cuneiform and base of the 1st metatarsal using a sagittal saw. With completion of the osteotomies, the surgical site was copiously irrigated and the bone fragment removed using sharp dissection. The arthrodesis surfaces were drilled using a 0.62" k-wire and then fish scaled using a small osteotome and mallet. The arthrodesis site was reduced and temporarily fixated using a k-wire. Intraoperative fluoroscopy was then used to assess the correction of the deformity and reduction of the arthrodesis site and both noted to be adequate. A Lapiplasty TMT plate x 2 provided was then temporarily fixed using olive wires. Locking screws were then used to affix the plate in the standard AO fashion. The temporary fixation wire was removed at this time. Intraoperative fluoroscopy was once again used to assess the reduction and fixation of the arthrodesis site as well as correction of the deformity. There was excellent reduction of the IM angle, however the arthrodesis site did demonstrate some slight gapping what was not present during temporary reduction. Removal of the plates/screws and re-reduction was considered however it would have been more detrimental with additional drilling and loss of screw purchase into bone. Decision was made to proceed with primary closure. The patient is very young and healthy and should still have the potential for complete arthrodesis.  Copious irrigation was then utilized in preparation for primary closure. Subcutaneous tissues were  re-approximated with  simple interrupted suture of 4-0 Vicryl followed by primary closure of superficial skin edges.  Dry sterile compressive dressings were then applied to all previously mentioned incision sites about the patient's lower extremity. The tourniquet which was used for hemostasis was deflated. All normal neurovascular responses including pink color and warmth returned all the digits of patient's lower extremity.  The patient was then transferred from the operating room to the recovery room having tolerated the procedure and anesthesia well. All vital signs are stable. After a brief stay in the recovery room the patient was discharged with adequate prescriptions for analgesia. Verbal as well as written instructions were provided for the patient regarding wound care. The patient is to keep the dressings clean dry and intact until they are to follow surgeon Dr. Daylene Katayama in the office upon discharge.   Edrick Kins, DPM [facility]  Dr. Edrick Kins, DPM    [address], [place] [address]                Office [phone]  Fax [phone]

## 2021-04-30 NOTE — Progress Notes (Signed)
Assisted Dr. Greg Stoltzfus with right, ultrasound guided, popliteal block. Side rails up, monitors on throughout procedure. See vital signs in flow sheet. Tolerated Procedure well. 

## 2021-04-30 NOTE — Brief Op Note (Signed)
04/30/2021  3:55 PM  PATIENT:  Kiara Mcfarland  26 y.o. female  PRE-OPERATIVE DIAGNOSIS:  BUNION RIGHT FOOT  POST-OPERATIVE DIAGNOSIS:  BUNION RIGHT FOOT  PROCEDURE:  Procedure(s): HALLUX VALGUS LAPIDUS (Right)  SURGEON:  Surgeon(s) and Role:    Felecia Shelling, DPM - Primary  PHYSICIAN ASSISTANT:   ASSISTANTS: none   ANESTHESIA:   general  EBL:  2 mL   BLOOD ADMINISTERED:none  DRAINS: none   LOCAL MEDICATIONS USED:  NONE  SPECIMEN:  No Specimen  DISPOSITION OF SPECIMEN:  N/A  COUNTS:  YES  TOURNIQUET:   Total Tourniquet Time Documented: Ankle (Right) - 83 minutes Total: Ankle (Right) - 83 minutes   DICTATION: .Reubin Milan Dictation  PLAN OF CARE: Discharge to home after PACU  PATIENT DISPOSITION:  PACU - hemodynamically stable.   Delay start of Pharmacological VTE agent (>24hrs) due to surgical blood loss or risk of bleeding: not applicable

## 2021-04-30 NOTE — Progress Notes (Signed)
Changed pain medicine prescription to [facility] states that he states her original pharmacy was already closed. -Dr. Marylene Land

## 2021-04-30 NOTE — Anesthesia Procedure Notes (Signed)
Anesthesia Regional Block: Popliteal block   Pre-Anesthetic Checklist: , timeout performed,  Correct Patient, Correct Site, Correct Laterality,  Correct Procedure, Correct Position, site marked,  Risks and benefits discussed,  Surgical consent,  Pre-op evaluation,  At surgeon's request and post-op pain management  Laterality: Right  Prep: Dura Prep       Needles:  Injection technique: Single-shot  Needle Type: Echogenic Stimulator Needle     Needle Length: 10cm  Needle Gauge: 20     Additional Needles:   Procedures:,,,, ultrasound used (permanent image in chart),,    Narrative:  Start time: 04/30/2021 12:40 PM End time: 04/30/2021 12:46 PM Injection made incrementally with aspirations every 5 mL.  Performed by: Personally  Anesthesiologist: Darral Dash, DO  Additional Notes: Patient identified. Risks/Benefits/Options discussed with patient including but not limited to bleeding, infection, nerve damage, failed block, incomplete pain control. Patient expressed understanding and wished to proceed. All questions were answered. Sterile technique was used throughout the entire procedure. Please see nursing notes for vital signs. Aspirated in 5cc intervals with injection for negative confirmation. Patient was given instructions on fall risk and not to get out of bed. All questions and concerns addressed with instructions to call with any issues or inadequate analgesia.

## 2021-04-30 NOTE — Transfer of Care (Signed)
Immediate Anesthesia Transfer of Care Note  Patient: Kiara Mcfarland  Procedure(s) Performed: HALLUX VALGUS LAPIDUS (Right: Foot)  Patient Location: PACU  Anesthesia Type:MAC and Regional  Level of Consciousness: awake and patient cooperative  Airway & Oxygen Therapy: Patient Spontanous Breathing  Post-op Assessment: Report given to RN and Post -op Vital signs reviewed and stable  Post vital signs: Reviewed and stable  Last Vitals:  Vitals Value Taken Time  BP 134/74 04/30/21 1546  Temp    Pulse    Resp 17 04/30/21 1548  SpO2    Vitals shown include unvalidated device data.  Last Pain:  Vitals:   04/30/21 1240  TempSrc:   PainSc: 0-No pain      Patients Stated Pain Goal: 6 (48/59/27 6394)  Complications: No notable events documented.

## 2021-04-30 NOTE — Discharge Instructions (Addendum)
°  Post Anesthesia Home Care Instructions  Activity: Get plenty of rest for the remainder of the day. A responsible individual must stay with you for 24 hours following the procedure.  For the next 24 hours, DO NOT: -Drive a car -Advertising copywriter -Drink alcoholic beverages -Take any medication unless instructed by your physician -Make any legal decisions or sign important papers.  Meals: Start with liquid foods such as gelatin or soup. Progress to regular foods as tolerated. Avoid greasy, spicy, heavy foods. If nausea and/or vomiting occur, drink only clear liquids until the nausea and/or vomiting subsides. Call your physician if vomiting continues.  Special Instructions/Symptoms: Your throat may feel dry or sore from the anesthesia or the breathing tube placed in your throat during surgery. If this causes discomfort, gargle with warm salt water. The discomfort should disappear within 24 hours.  If you had a scopolamine patch placed behind your ear for the management of post- operative nausea and/or vomiting:  1. The medication in the patch is effective for 72 hours, after which it should be removed.  Wrap patch in a tissue and discard in the trash. Wash hands thoroughly with soap and water. 2. You may remove the patch earlier than 72 hours if you experience unpleasant side effects which may include dry mouth, dizziness or visual disturbances. 3. Avoid touching the patch. Wash your hands with soap and water after contact with the patch.     Regional Anesthesia Blocks  1. Numbness or the inability to move the "blocked" extremity may last from 3-48 hours after placement. The length of time depends on the medication injected and your individual response to the medication. If the numbness is not going away after 48 hours, call your surgeon.  2. The extremity that is blocked will need to be protected until the numbness is gone and the  Strength has returned. Because you cannot feel it, you will  need to take extra care to avoid injury. Because it may be weak, you may have difficulty moving it or using it. You may not know what position it is in without looking at it while the block is in effect.  3. For blocks in the legs and feet, returning to weight bearing and walking needs to be done carefully. You will need to wait until the numbness is entirely gone and the strength has returned. You should be able to move your leg and foot normally before you try and bear weight or walk. You will need someone to be with you when you first try to ensure you do not fall and possibly risk injury.  4. Bruising and tenderness at the needle site are common side effects and will resolve in a few days.  5. Persistent numbness or new problems with movement should be communicated to the surgeon or the [facility] [phone] [facility] [phone]).   See post surgery instructions from [facility] (attached)

## 2021-04-30 NOTE — Anesthesia Preprocedure Evaluation (Signed)
Anesthesia Evaluation  Patient identified by MRN, date of birth, ID band Patient awake    Reviewed: Allergy & Precautions, NPO status , Patient's Chart, lab work & pertinent test results  Airway Mallampati: II  TM Distance: >3 FB Neck ROM: Full    Dental no notable dental hx.    Pulmonary neg pulmonary ROS,    Pulmonary exam normal        Cardiovascular negative cardio ROS   Rhythm:Regular Rate:Normal     Neuro/Psych Depression negative neurological ROS     GI/Hepatic negative GI ROS, Neg liver ROS,   Endo/Other  negative endocrine ROS  Renal/GU negative Renal ROS  negative genitourinary   Musculoskeletal bunion   Abdominal Normal abdominal exam  (+)   Peds  Hematology negative hematology ROS (+)   Anesthesia Other Findings   Reproductive/Obstetrics                             Anesthesia Physical Anesthesia Plan  ASA: 2  Anesthesia Plan: Regional and MAC   Post-op Pain Management: Regional block   Induction: Intravenous  PONV Risk Score and Plan: 2 and Ondansetron, Dexamethasone, Propofol infusion, Midazolam and Treatment may vary due to age or medical condition  Airway Management Planned: Simple Face Mask, Natural Airway and Nasal Cannula  Additional Equipment: None  Intra-op Plan:   Post-operative Plan:   Informed Consent: I have reviewed the patients History and Physical, chart, labs and discussed the procedure including the risks, benefits and alternatives for the proposed anesthesia with the patient or authorized representative who has indicated his/her understanding and acceptance.     Dental advisory given  Plan Discussed with: CRNA  Anesthesia Plan Comments: (Lab Results      Component                Value               Date                      PREGTESTUR               NEGATIVE            04/30/2021          )        Anesthesia Quick Evaluation

## 2021-05-03 NOTE — Anesthesia Postprocedure Evaluation (Signed)
Anesthesia Post Note  Patient: Kiara Mcfarland  Procedure(s) Performed: HALLUX VALGUS LAPIDUS (Right: Foot)     Patient location during evaluation: PACU Anesthesia Type: Regional Level of consciousness: awake and alert Pain management: pain level controlled Vital Signs Assessment: post-procedure vital signs reviewed and stable Respiratory status: spontaneous breathing Cardiovascular status: stable Anesthetic complications: no   No notable events documented.  Last Vitals:  Vitals:   04/30/21 1615 04/30/21 1647  BP: 111/77 107/68  Pulse:    Resp: 14   Temp:  36.6 C  SpO2: 100% 99%    Last Pain:  Vitals:   04/30/21 1647  TempSrc:   PainSc: 3                  Lewie Loron

## 2021-05-04 ENCOUNTER — Encounter (HOSPITAL_BASED_OUTPATIENT_CLINIC_OR_DEPARTMENT_OTHER): Payer: Self-pay | Admitting: Podiatry

## 2021-05-05 ENCOUNTER — Ambulatory Visit (INDEPENDENT_AMBULATORY_CARE_PROVIDER_SITE_OTHER)

## 2021-05-05 ENCOUNTER — Other Ambulatory Visit: Payer: Self-pay

## 2021-05-05 ENCOUNTER — Ambulatory Visit (INDEPENDENT_AMBULATORY_CARE_PROVIDER_SITE_OTHER): Admitting: Podiatry

## 2021-05-05 DIAGNOSIS — Z9889 Other specified postprocedural states: Secondary | ICD-10-CM

## 2021-05-05 NOTE — Progress Notes (Signed)
° °  Subjective:  Patient presents today status post Lapidus type bunionectomy right foot. DOS: 04/29/2021.  Patient states that she is doing very well.  She has minimal pain associated to the area.  She has been nonweightbearing in the cam boot with the crutches.  No new complaints at this time  Past Medical History:  Diagnosis Date   History of COVID-19 2021   per pt asymptomatic      Objective/Physical Exam Neurovascular status intact.  Skin incisions appear to be well coapted with sutures intact. No sign of infectious process noted. No dehiscence. No active bleeding noted.  Minimal edema noted to the surgical extremity.  Radiographic Exam:  Orthopedic hardware and arthrodesis site appears stable with routine healing.  Orthopedic hardware is intact.  Good alignment of the first ray.  Assessment: 1. s/p Lapidus type bunionectomy right.  04/29/2021  Plan of Care:  1. Patient was evaluated. X-rays reviewed 2.  Dressings changed.  Clean dry and intact x1 week 3.  Patient may begin partial weightbearing with the assistance of crutches in the cam boot 4.  Return to clinic 1 week for suture removal   Felecia Shelling, DPM [facility]  Dr. Felecia Shelling, DPM    [address] [place], [place] [address]                Office [phone]  Fax [phone]

## 2021-05-12 ENCOUNTER — Other Ambulatory Visit: Payer: Self-pay

## 2021-05-12 ENCOUNTER — Ambulatory Visit (INDEPENDENT_AMBULATORY_CARE_PROVIDER_SITE_OTHER): Payer: BC Managed Care – PPO | Admitting: Podiatry

## 2021-05-12 DIAGNOSIS — Z9889 Other specified postprocedural states: Secondary | ICD-10-CM

## 2021-05-12 NOTE — Progress Notes (Signed)
° °  Subjective:  Patient presents today status post Lapidus type bunionectomy right foot. DOS: 04/29/2021.  Patient continues to do very well. She has been doing very well and has no pain.  She has been nonweightbearing in the cam boot.  Past Medical History:  Diagnosis Date   History of COVID-19 2021   per pt asymptomatic   Past Surgical History:  Procedure Laterality Date   BUNIONECTOMY Left 07/2017   HALLUX VALGUS LAPIDUS Right 04/30/2021   Procedure: HALLUX VALGUS LAPIDUS;  Surgeon: Felecia Shelling, DPM;  Location: [facility];  Service: Podiatry;  Laterality: Right;   Allergies  Allergen Reactions   Nickel Rash     Objective/Physical Exam Neurovascular status intact.  Skin incisions appear to be well coapted with sutures intact. No sign of infectious process noted. No dehiscence. No active bleeding noted.  Minimal edema noted to the surgical extremity.  Assessment: 1. s/p Lapidus type bunionectomy right.  04/29/2021  Plan of Care:  1. Patient was evaluated.  2. Partial sutures removed. The larger incision of the foot did look like the incision had not healed completely and incisions to this area were left intact. 3. Patient may weight bear in the CAM boot 4. Recommend ace wrap daily 5. Return to clinic in 2 weeks for f/u xray   *Mother's name is Dalene Seltzer, DPM [facility]  Dr. Felecia Shelling, DPM    [address] [place], [place] [address]                Office [phone]  Fax [phone]

## 2021-05-19 ENCOUNTER — Ambulatory Visit (INDEPENDENT_AMBULATORY_CARE_PROVIDER_SITE_OTHER): Payer: BC Managed Care – PPO

## 2021-05-19 ENCOUNTER — Ambulatory Visit (INDEPENDENT_AMBULATORY_CARE_PROVIDER_SITE_OTHER): Payer: BC Managed Care – PPO | Admitting: Podiatry

## 2021-05-19 ENCOUNTER — Other Ambulatory Visit: Payer: Self-pay

## 2021-05-19 DIAGNOSIS — Z9889 Other specified postprocedural states: Secondary | ICD-10-CM

## 2021-05-19 MED ORDER — DOXYCYCLINE HYCLATE 100 MG PO TABS: 100.0000 mg | ORAL_TABLET | Freq: Two times a day (BID) | ORAL | 0 refills | Status: DC

## 2021-05-19 NOTE — Progress Notes (Signed)
° °  Subjective:  Patient presents today status post Lapidus type bunionectomy right foot. DOS: 04/29/2021.  Patient states that she is doing very well.  She has minimal pain associated to the area.  She has been mostly using the crutches and with the cam boot.  No new complaints at this time  Past Medical History:  Diagnosis Date   History of COVID-19 2021   per pt asymptomatic      Objective/Physical Exam Neurovascular status intact.  Skin incisions appear to be well coapted with sutures intact. No sign of infectious process noted. No dehiscence. No active bleeding noted.  Negative for any significant edema.  There continues to be some slight drainage along the incision site.  Serous drainage.  No purulence.  No active bleeding.  Radiographic Exam:  Orthopedic hardware and arthrodesis site appears stable with routine healing.  Orthopedic hardware is intact.  Good alignment of the first ray.  Assessment: 1. s/p Lapidus type bunionectomy right.  04/29/2021  Plan of Care:  1. Patient was evaluated. X-rays reviewed 2.  Sutures removed.  There is some slight drainage to the incision site after removal of the stitches 3. Rx doxycycline 100mg  BID #20 4. Continue WB in CAM boot 5.  Return to clinic in 3 weeks for follow-up x-ray and to transition the patient out of the cam boot   , DPM [facility]  Dr. Felecia Shelling, DPM    [address] [place], [place]                Office [phone]  Fax [phone]

## 2021-05-26 ENCOUNTER — Encounter: Admitting: Podiatry

## 2021-06-02 ENCOUNTER — Ambulatory Visit (INDEPENDENT_AMBULATORY_CARE_PROVIDER_SITE_OTHER): Payer: BC Managed Care – PPO | Admitting: Podiatry

## 2021-06-02 ENCOUNTER — Ambulatory Visit (INDEPENDENT_AMBULATORY_CARE_PROVIDER_SITE_OTHER): Payer: BC Managed Care – PPO

## 2021-06-02 ENCOUNTER — Other Ambulatory Visit: Payer: Self-pay

## 2021-06-02 DIAGNOSIS — Z9889 Other specified postprocedural states: Secondary | ICD-10-CM

## 2021-06-02 NOTE — Progress Notes (Signed)
? ?  Subjective:  ?Patient presents today status post Lapidus type bunionectomy right foot. DOS: 04/29/2021.  Patient states that she continues to do very well.  She has no pain associated to the area.  She has been weightbearing in the cam boot as instructed.  No new complaints at this time ? ?Past Medical History:  ?Diagnosis Date  ? History of COVID-19 2021  ? per pt asymptomatic  ? ?  ?Past Surgical History:  ?Procedure Laterality Date  ? BUNIONECTOMY Left 07/2017  ? HALLUX VALGUS LAPIDUS Right 04/30/2021  ? Procedure: HALLUX VALGUS LAPIDUS;  Surgeon: Edrick Kins, DPM;  Location: [facility];  Service: Podiatry;  Laterality: Right;  ? ?Allergies  ?Allergen Reactions  ? Nickel Rash  ? ?Objective/Physical Exam ?No pain associated to the foot at all.  Neurovascular status intact.  Skin incisions appear to be well coapted with exception of a small area of dehiscence about 2 cm in length along the proximal portion of the incision site.  There is no drainage or erythema or edema around the area however this area has not completely coapted.. No sign of infectious process noted.  ? ?Radiographic Exam:  ?Orthopedic hardware and arthrodesis site appears stable with routine healing.  Orthopedic hardware is intact.  First metatarsal cuneiform site on lateral view does appear to have some diastases between the two bones.  We will observe for now to ensure that it heals appropriately ? ?Assessment: ?1. s/p Lapidus type bunionectomy right.  04/29/2021 ? ?Plan of Care:  ?1. Patient was evaluated. X-rays reviewed ?2.  Regarding the proximal incision site, continue Silvadene cream and a Band-Aid over the area until this has completely resolved.  We will continue to monitor for now ?3.  Antibiotics were prescribed last visit due to some drainage in the area.  Although there is no drainage today and the incision site looks good go ahead and finish the antibiotics as prescribed ?4.  Patient is now over 1 month postop.   She may discontinue the cam boot.  Postsurgical shoe dispensed.  Weightbearing as tolerated in the postsurgical shoe.  We will keep the patient in the postsurgical shoe until the incision site has healed completely ?5.  Return to clinic in 2 weeks ? ?Edrick Kins, DPM ?[facility] ? ?Dr. Edrick Kins, DPM  ?  ?[address]                                    ?[place], [place] [address]                ?Office [phone]  ?Fax [phone] ? ? ? ? ? ?

## 2021-06-16 ENCOUNTER — Encounter: Payer: BC Managed Care – PPO | Admitting: Podiatry

## 2021-06-18 ENCOUNTER — Encounter: Payer: Self-pay | Admitting: Podiatry

## 2021-06-18 ENCOUNTER — Other Ambulatory Visit: Payer: Self-pay

## 2021-06-18 ENCOUNTER — Ambulatory Visit (INDEPENDENT_AMBULATORY_CARE_PROVIDER_SITE_OTHER): Payer: BC Managed Care – PPO | Admitting: Podiatry

## 2021-06-18 DIAGNOSIS — Z9889 Other specified postprocedural states: Secondary | ICD-10-CM

## 2021-06-21 ENCOUNTER — Encounter: Payer: BC Managed Care – PPO | Admitting: Podiatry

## 2021-06-27 NOTE — Progress Notes (Signed)
? ?  Subjective:  ?Patient presents today status post Lapidus type bunionectomy right foot. DOS: 04/29/2021.  Patient continues to do well.  No pain.  She would like to pursue some physical therapy to help with range of motion and gait training.  no new complaints at this time ? ?Past Medical History:  ?Diagnosis Date  ? History of COVID-19 2021  ? per pt asymptomatic  ? ?  ?Past Surgical History:  ?Procedure Laterality Date  ? BUNIONECTOMY Left 07/2017  ? HALLUX VALGUS LAPIDUS Right 04/30/2021  ? Procedure: HALLUX VALGUS LAPIDUS;  Surgeon: Felecia Shelling, DPM;  Location: [facility];  Service: Podiatry;  Laterality: Right;  ? ?Allergies  ?Allergen Reactions  ? Nickel Rash  ? ?Objective/Physical Exam ?No pain associated to the foot at all.  Neurovascular status intact.  Skin incisions healed.  Muscle strength 5/5 all compartments ? ?Radiographic Exam 06/02/2021 RT foot:  ?Orthopedic hardware and arthrodesis site appears stable with routine healing.  Orthopedic hardware is intact.  First metatarsal cuneiform site on lateral view does appear to have some diastases between the two bones.  We will observe for now to ensure that it heals appropriately ? ?Assessment: ?1. s/p Lapidus type bunionectomy right.  04/29/2021 ? ?Plan of Care:  ?1. Patient was evaluated. ?2.  Order placed for physical therapy at [facility] ?3.  Continue wearing good supportive shoes and sneakers ?4.  Return to clinic in 6 weeks for final follow-up x-ray ? ?Felecia Shelling, DPM ?[facility] ? ?Dr. Felecia Shelling, DPM  ?  ?[address]                                    ?[place], [place] [address]                ?Office [phone]  ?Fax [phone] ? ? ? ? ? ?

## 2021-07-27 ENCOUNTER — Ambulatory Visit (INDEPENDENT_AMBULATORY_CARE_PROVIDER_SITE_OTHER): Payer: No Typology Code available for payment source | Admitting: Podiatry

## 2021-07-27 ENCOUNTER — Ambulatory Visit: Payer: Self-pay

## 2021-07-27 DIAGNOSIS — Z9889 Other specified postprocedural states: Secondary | ICD-10-CM

## 2021-07-27 NOTE — Progress Notes (Signed)
? ?  Subjective:  ?Patient presents today status post Lapidus type bunionectomy right foot. DOS: 04/29/2021.  Patient is doing well.  Very minimal minor intermittent pain.  Patient states that she was never contacted for physical therapy.  No new complaints at this time ? ?Past Medical History:  ?Diagnosis Date  ? History of COVID-19 2021  ? per pt asymptomatic  ? ?  ?Past Surgical History:  ?Procedure Laterality Date  ? BUNIONECTOMY Left 07/2017  ? HALLUX VALGUS LAPIDUS Right 04/30/2021  ? Procedure: HALLUX VALGUS LAPIDUS;  Surgeon: Edrick Kins, DPM;  Location: [facility];  Service: Podiatry;  Laterality: Right;  ? ?Allergies  ?Allergen Reactions  ? Nickel Rash  ? ?Objective/Physical Exam ?No pain associated to the foot at all.  Neurovascular status intact.  Skin incisions healed.  Muscle strength 5/5 all compartments.  Patient states that this she does feel some stiffness and weakness to the great toe joint. ? ?Radiographic Exam 07/27/2021 RT foot:  ?Orthopedic hardware and arthrodesis site appears stable with routine healing.  Orthopedic hardware is intact.  Good alignment of the first ray and reduction of the IM angle ? ?Assessment: ?1. s/p Lapidus type bunionectomy right.  04/29/2021 ? ?Plan of Care:  ?1. Patient was evaluated. ?2.  Apparently the patient was never contacted for physical therapy at [facility].  New order was placed today for physical therapy at [facility] ?3.  Continue wearing good supportive shoes and sneakers ?4.  Return to clinic in 6 weeks for final follow-up x-ray ? ?Edrick Kins, DPM ?[place] ? ?Dr. Edrick Kins, DPM  ?  ?[address]                                    ?[place], [place] [address]                ?Office [phone]  ?Fax [phone] ? ? ? ? ? ?

## 2021-07-28 ENCOUNTER — Encounter: Payer: Self-pay | Admitting: Podiatry

## 2021-08-31 ENCOUNTER — Encounter: Payer: Self-pay | Admitting: *Deleted

## 2021-10-12 ENCOUNTER — Ambulatory Visit: Payer: No Typology Code available for payment source | Admitting: Podiatry

## 2021-10-13 ENCOUNTER — Ambulatory Visit: Payer: No Typology Code available for payment source | Admitting: Podiatry

## 2021-11-10 ENCOUNTER — Ambulatory Visit (INDEPENDENT_AMBULATORY_CARE_PROVIDER_SITE_OTHER): Payer: No Typology Code available for payment source | Admitting: Podiatry

## 2021-11-10 ENCOUNTER — Ambulatory Visit (INDEPENDENT_AMBULATORY_CARE_PROVIDER_SITE_OTHER): Payer: No Typology Code available for payment source

## 2021-11-10 DIAGNOSIS — M7751 Other enthesopathy of right foot: Secondary | ICD-10-CM

## 2021-11-10 NOTE — Progress Notes (Signed)
   Chief Complaint  Patient presents with   Foot Pain    right foot is not healing-previous sx, patient states that when she applies pressure on the foot it hurts.    Subjective:  Patient presents today status post Lapidus type bunionectomy right foot. DOS: 04/29/2021.  Patient states that she is having some tenderness with walking and exercising especially to the proximal area just proximal to the surgical incision site.  Pain can be 6/10 intermittently.  She is wearing good supportive shoes and sneakers.  Denies any recent history of injury.  Past Medical History:  Diagnosis Date   History of COVID-19 2021   per pt asymptomatic     Past Surgical History:  Procedure Laterality Date   BUNIONECTOMY Left 07/2017   HALLUX VALGUS LAPIDUS Right 04/30/2021   Procedure: HALLUX VALGUS LAPIDUS;  Surgeon: Felecia Shelling, DPM;  Location: [facility];  Service: Podiatry;  Laterality: Right;   Allergies  Allergen Reactions   Nickel Rash   Objective/Physical Exam No pain associated to the foot at all.  Neurovascular status intact.  Skin incisions healed.  Muscle strength 5/5 all compartments.  There is no edema associated to the foot.  Palpation of the surgical area is actually asymptomatic.  The patient is experiencing pain just proximal to the surgical area  Radiographic Exam 11/10/2021 RT foot:  Orthopedic hardware and arthrodesis site appears stable with routine healing.  Orthopedic hardware is intact.  Good alignment of the first ray and reduction of the IM angle.  There continues to be some visibility of the arthrodesis site.  Also increased diastases of the Lisfranc joint/ligament between the base of the second metatarsal and medial cuneiform  Assessment: 1. s/p Lapidus type bunionectomy right.  04/29/2021  Plan of Care:  1. Patient was evaluated. 2.  Injection of 0.5 cc Celestone Soluspan injected just proximal to the incision site around the area of the tibialis anterior  tendon 3.  Resume meloxicam 15 mg daily 4.  Recommend good supportive shoes and sneakers.  Advised against going barefoot 5.  Return to clinic in 6 weeks  Felecia Shelling, DPM [facility]  Dr. Felecia Shelling, DPM    [address] [place], [place] [address]                Office [phone]  Fax [phone]

## 2021-11-15 ENCOUNTER — Ambulatory Visit: Payer: No Typology Code available for payment source | Admitting: Podiatry

## 2021-12-22 ENCOUNTER — Ambulatory Visit: Payer: No Typology Code available for payment source | Admitting: Podiatry

## 2022-01-03 ENCOUNTER — Ambulatory Visit (INDEPENDENT_AMBULATORY_CARE_PROVIDER_SITE_OTHER): Payer: No Typology Code available for payment source

## 2022-01-03 ENCOUNTER — Ambulatory Visit (INDEPENDENT_AMBULATORY_CARE_PROVIDER_SITE_OTHER): Payer: No Typology Code available for payment source | Admitting: Podiatry

## 2022-01-03 DIAGNOSIS — Z9889 Other specified postprocedural states: Secondary | ICD-10-CM

## 2022-01-03 DIAGNOSIS — M960 Pseudarthrosis after fusion or arthrodesis: Secondary | ICD-10-CM

## 2022-01-03 NOTE — Progress Notes (Signed)
   Chief Complaint  Patient presents with   Foot Pain    Patient states that she is still having sharp pain when she applies pressure on te right foot.    Subjective:  Patient presents today status post Lapidus type bunionectomy right foot. DOS: 04/29/2021.  Patient continues to have intermittent pain and tenderness to the surgical foot.  Denies any recent history of injury.  She wears good supportive shoes and sneakers  Past Medical History:  Diagnosis Date   History of COVID-19 2021   per pt asymptomatic     Past Surgical History:  Procedure Laterality Date   BUNIONECTOMY Left 07/2017   HALLUX VALGUS LAPIDUS Right 04/30/2021   Procedure: HALLUX VALGUS LAPIDUS;  Surgeon: Edrick Kins, DPM;  Location: [facility];  Service: Podiatry;  Laterality: Right;   Allergies  Allergen Reactions   Nickel Rash   Objective/Physical Exam No pain associated to the foot at all.  Neurovascular status intact.  Skin incisions healed.  Muscle strength 5/5 all compartments.  There is no edema associated to the foot.  There is some tenderness and pain with palpation along the surgical site of the foot  Radiographic Exam 01/03/2022 RT foot:  Orthopedic hardware and arthrodesis site appears stable however unfortunately there continues to be radiolucency throughout the arthrodesis site.  Patient is now 8 months postop and demonstrating nonunion of the arthrodesis site..  Orthopedic hardware is intact.  Good alignment of the first ray and reduction of the IM angle.  Stable increased diastases of the Lisfranc joint/ligament between the base of the second metatarsal and medial cuneiform  Assessment: 1. s/p Lapidus type bunionectomy right.  04/29/2021 2.  Nonunion of arthrodesis site right foot  Plan of Care:  1. Patient was evaluated. 2.  Unfortunately the patient continues to have some radiolucency throughout the arthrodesis site even though she is 8 months postop.  X-rays do not demonstrate  arthrodesis and essentially nonunion of the first TMT. 3.  Order placed for [employer] bone stimulator.  4.  In the meantime continue good supportive shoes and sneakers  5.  Return to clinic 8 weeks for follow-up x-ray  Edrick Kins, DPM [facility]  Dr. Edrick Kins, DPM    [address], [place] [address]                Office [phone]  Fax [phone]

## 2022-01-11 ENCOUNTER — Other Ambulatory Visit: Payer: Self-pay | Admitting: Podiatry

## 2022-01-11 ENCOUNTER — Telehealth: Payer: Self-pay | Admitting: *Deleted

## 2022-01-11 DIAGNOSIS — M960 Pseudarthrosis after fusion or arthrodesis: Secondary | ICD-10-CM

## 2022-01-11 NOTE — Telephone Encounter (Signed)
Patient is calling for the status of a bone stimulator(please place order,not in epic) to be sent to [employer].

## 2022-01-11 NOTE — Telephone Encounter (Signed)
Order placed in patient's chart. Thanks! - Dr. Amalia Hailey

## 2022-01-17 NOTE — Telephone Encounter (Signed)
Faxed order to [employer] for processing,contacted [employer] said that once received, processed, will contact patient. Called patient giving her this information and [employer]'s number to contact, verbalized that she is very upset since they could not give her a date on when she will receive, is in a lot of pain, and is trying to avoid having surgery.

## 2022-03-07 ENCOUNTER — Ambulatory Visit (INDEPENDENT_AMBULATORY_CARE_PROVIDER_SITE_OTHER): Payer: No Typology Code available for payment source

## 2022-03-07 ENCOUNTER — Encounter: Payer: Self-pay | Admitting: Podiatry

## 2022-03-07 ENCOUNTER — Ambulatory Visit (INDEPENDENT_AMBULATORY_CARE_PROVIDER_SITE_OTHER): Payer: No Typology Code available for payment source | Admitting: Podiatry

## 2022-03-07 VITALS — BP 107/49 | HR 70

## 2022-03-07 DIAGNOSIS — M960 Pseudarthrosis after fusion or arthrodesis: Secondary | ICD-10-CM

## 2022-03-07 NOTE — Progress Notes (Signed)
   Chief Complaint  Patient presents with   Follow-up    Patient is here for right foot post op follow-up, she states that it is  not healing well.    Subjective:  Patient presents today status post Lapidus type bunionectomy right foot. DOS: 04/29/2021.  Patient states that she continues to have some very mild tenderness and pain.  She has only had the [employer] bone stimulator for about 3 weeks now.  She has been using it daily.  She presents for further treatment and evaluation  Past Medical History:  Diagnosis Date   History of COVID-19 2021   per pt asymptomatic     Past Surgical History:  Procedure Laterality Date   BUNIONECTOMY Left 07/2017   HALLUX VALGUS LAPIDUS Right 04/30/2021   Procedure: HALLUX VALGUS LAPIDUS;  Surgeon: Felecia Shelling, DPM;  Location: [facility];  Service: Podiatry;  Laterality: Right;   Allergies  Allergen Reactions   Nickel Rash   Objective/Physical Exam No pain associated to the foot at all.  Neurovascular status intact.  Skin incisions healed.  Muscle strength 5/5 all compartments.  There is no edema associated to the foot.  There is some tenderness and pain with palpation along the surgical site of the foot  Radiographic Exam 03/07/2022 RT foot:  There is some subtle changes noted compared to prior x-rays which demonstrate some slight improvement.  Orthopedic hardware and arthrodesis site appears stable.  There continues to be radiolucency throughout the arthrodesis site. Good alignment of the first ray and reduction of the IM angle.    Assessment: 1. s/p Lapidus type bunionectomy right.  04/29/2021 2.  Nonunion of arthrodesis site right foot  Plan of Care:  1. Patient was evaluated. 2.  Patient has only been using the [employer] bone stimulator for about 3 weeks.  Continue. 3.  Return to clinic in 6 weeks for follow-up x-rays  Felecia Shelling, DPM [facility]  Dr. Felecia Shelling, DPM    [address] [place], [place] [address]                Office [phone]  Fax [phone]

## 2022-04-22 ENCOUNTER — Ambulatory Visit: Payer: No Typology Code available for payment source | Admitting: Podiatry

## 2022-05-02 ENCOUNTER — Ambulatory Visit (INDEPENDENT_AMBULATORY_CARE_PROVIDER_SITE_OTHER): Payer: No Typology Code available for payment source

## 2022-05-02 ENCOUNTER — Ambulatory Visit (INDEPENDENT_AMBULATORY_CARE_PROVIDER_SITE_OTHER): Payer: No Typology Code available for payment source | Admitting: Podiatry

## 2022-05-02 DIAGNOSIS — Z9889 Other specified postprocedural states: Secondary | ICD-10-CM | POA: Diagnosis not present

## 2022-05-02 DIAGNOSIS — M960 Pseudarthrosis after fusion or arthrodesis: Secondary | ICD-10-CM

## 2022-05-02 NOTE — Progress Notes (Signed)
   Chief Complaint  Patient presents with   Routine Post Op    right foot post op follow-up    Subjective:  Patient presents today status post Lapidus type bunionectomy right foot. DOS: 04/29/2021.  Patient states that overall she is feeling significantly better.  She has been using the external bone stimulator daily.  She has noticed significant improvement of her pain.  Past Medical History:  Diagnosis Date   History of COVID-19 2021   per pt asymptomatic     Past Surgical History:  Procedure Laterality Date   BUNIONECTOMY Left 07/2017   HALLUX VALGUS LAPIDUS Right 04/30/2021   Procedure: HALLUX VALGUS LAPIDUS;  Surgeon: Edrick Kins, DPM;  Location: [facility];  Service: Podiatry;  Laterality: Right;   Allergies  Allergen Reactions   Nickel Rash   Objective/Physical Exam No pain associated to the foot at all.  Neurovascular status intact.  Skin incisions healed.  Muscle strength 5/5 all compartments.  There is no edema associated to the foot.  No appreciable tenderness with palpation throughout the foot  Radiographic Exam 05/02/2022 RT foot:  Significant improvement noted compared to prior x-rays.  Near osseous union of the arthrodesis site.  Orthopedic hardware and arthrodesis site appears stable.  There continues to be radiolucency throughout the arthrodesis site. Good alignment of the first ray and reduction of the IM angle.    Assessment: 1. s/p Lapidus type bunionectomy right.  04/29/2021 2.  Nonunion of arthrodesis site right foot; improved  Plan of Care:  1. Patient was evaluated. 2.  Continue [employer] bone stimulator daily 3.  Continue wearing good supportive shoes and sneakers 4.  Return to clinic 2 months for follow-up x-ray   Edrick Kins, DPM [facility]  Dr. Edrick Kins, DPM    [address], [place] [address]                Office [phone]  Fax [phone]

## 2022-05-04 ENCOUNTER — Other Ambulatory Visit: Payer: Self-pay | Admitting: Podiatry

## 2022-05-04 DIAGNOSIS — Z9889 Other specified postprocedural states: Secondary | ICD-10-CM

## 2022-05-04 DIAGNOSIS — M960 Pseudarthrosis after fusion or arthrodesis: Secondary | ICD-10-CM

## 2022-07-04 ENCOUNTER — Ambulatory Visit: Payer: No Typology Code available for payment source | Admitting: Podiatry

## 2022-12-15 ENCOUNTER — Encounter: Payer: Self-pay | Admitting: Family Medicine

## 2022-12-16 ENCOUNTER — Encounter: Payer: Self-pay | Admitting: Family Medicine

## 2022-12-16 ENCOUNTER — Other Ambulatory Visit (HOSPITAL_COMMUNITY)
Admission: RE | Admit: 2022-12-16 | Discharge: 2022-12-16 | Disposition: A | Discharge status: Home / Self Care | Payer: No Typology Code available for payment source | Source: Ambulatory Visit | Attending: Family Medicine | Admitting: Family Medicine

## 2022-12-16 ENCOUNTER — Ambulatory Visit (INDEPENDENT_AMBULATORY_CARE_PROVIDER_SITE_OTHER): Payer: No Typology Code available for payment source | Admitting: Family Medicine

## 2022-12-16 VITALS — BP 111/66 | HR 60 | Ht 62.0 in | Wt 163.8 lb

## 2022-12-16 DIAGNOSIS — R140 Abdominal distension (gaseous): Secondary | ICD-10-CM | POA: Diagnosis not present

## 2022-12-16 DIAGNOSIS — N898 Other specified noninflammatory disorders of vagina: Secondary | ICD-10-CM | POA: Diagnosis not present

## 2022-12-16 DIAGNOSIS — Z124 Encounter for screening for malignant neoplasm of cervix: Secondary | ICD-10-CM | POA: Insufficient documentation

## 2022-12-16 DIAGNOSIS — N946 Dysmenorrhea, unspecified: Secondary | ICD-10-CM | POA: Insufficient documentation

## 2022-12-16 DIAGNOSIS — Z0000 Encounter for general adult medical examination without abnormal findings: Secondary | ICD-10-CM | POA: Diagnosis not present

## 2022-12-16 DIAGNOSIS — Z6829 Body mass index (BMI) 29.0-29.9, adult: Secondary | ICD-10-CM | POA: Insufficient documentation

## 2022-12-16 LAB — POCT WET PREP (WET MOUNT)
Clue Cells Wet Prep Whiff POC: NEGATIVE
Trichomonas Wet Prep HPF POC: ABSENT

## 2022-12-16 MED ORDER — NAPROXEN 500 MG PO TABS: 500.0000 mg | ORAL_TABLET | Freq: Two times a day (BID) | ORAL | 1 refills | Status: DC | PRN

## 2022-12-16 NOTE — Assessment & Plan Note (Addendum)
Naproxen escribed prn menstrual pain Declined OCP for now Not actively sexually active

## 2022-12-16 NOTE — Assessment & Plan Note (Signed)
BMI in the overweight range She is doing well with diet and exercise Dietitian referral discussed, but deferring for now per her request Continue lifestyle modification and monitor closely

## 2022-12-16 NOTE — Patient Instructions (Addendum)
Abdominal Bloating ( Trial Gas- X prn bloating and Miralax prn constipation When you have abdominal bloating, your abdomen may feel full, tight, or painful. It may also look bigger than normal or swollen (distended). Common causes of abdominal bloating include: Swallowing air. Constipation. Problems digesting food. Eating too much. Irritable bowel syndrome. This is a condition that affects the large intestine. Lactose intolerance. This is an inability to digest lactose, a natural sugar in dairy products. Celiac disease. This is a condition that affects the ability to digest gluten, a protein found in some grains. Gastroparesis. This is a condition that slows down the movement of food in the stomach and small intestine. It is more common in people with diabetes mellitus. Gastroesophageal reflux disease (GERD). This is a condition that makes stomach acid flow back into the esophagus. Urinary retention. This means that the body is holding onto urine, and the bladder cannot be emptied all the way. Follow these instructions at home: Eating and drinking Avoid eating too much. Try not to swallow air while talking or eating. Avoid eating while lying down. Avoid these foods and drinks: Foods that cause gas, such as broccoli, cabbage, cauliflower, and baked beans. Carbonated drinks. Hard candy. Chewing gum. Medicines Take over-the-counter and prescription medicines only as told by your health care provider. Take probiotic medicines. These medicines contain live bacteria or yeasts that can help digestion. Take coated peppermint oil capsules. General instructions Try to exercise regularly. Exercise may help to relieve bloating that is caused by gas and relieve constipation. Keep all follow-up visits. This is important. Contact a health care provider if: You have nausea and vomiting. You have diarrhea. You have abdominal pain. You have unusual weight loss or weight gain. You have severe pain,  and medicines do not help. Get help right away if: You have chest pain. You have trouble breathing. You have shortness of breath. You have trouble urinating. You have darker urine than normal. You have blood in your stools or have dark, tarry stools. These symptoms may represent a serious problem that is an emergency. Do not wait to see if the symptoms will go away. Get medical help right away. Call your local emergency services (911 in the [place] Do not drive yourself to the hospital. Summary Abdominal bloating means that the abdomen is swollen. Common causes of abdominal bloating are swallowing air, constipation, and problems digesting food. Avoid eating too much and avoid swallowing air. Avoid foods that cause gas, carbonated drinks, hard candy, and chewing gum. This information is not intended to replace advice given to you by your health care provider. Make sure you discuss any questions you have with your health care provider. Document Revised: 10/15/2019 Document Reviewed: 10/15/2019 Elsevier Patient Education  2024 [employer].

## 2022-12-16 NOTE — Progress Notes (Signed)
    SUBJECTIVE:   Chief compliant/HPI: annual examination  Kiara Mcfarland is a 27 y.o. who presents today for an annual exam.   Review of systems form notable for Abdomen:  Bloating: C/O abdominal bloating, which worsened this year. This occurs with certain foods. She is lactose intolerant, and she avoids dairy products as much as possible.  She's tried probiotics for digestion and ginger root. Also uses Bovine Colostrum from [employer], which helps with digestion. She endorsed slowed bowel habits over the last few months. She usually moves her BM daily, but now with BM every 3-4 days instead of daily. Denies belly pain.  Weight management: She eats healthy overall and can't seem to lose weight. She also endorses healthy eating habits.  Menstrual cramps: LMP 8/25, regular. She has issues with severe cramping with her menses, and she feels her menses are coming on soon. She uses Naproxen for relief. Other meds do not help. She requested to be put on Naproxen prn for this.   Updated history tabs and problem list PMhx reviewed.   OBJECTIVE:   BP 111/66   Pulse 60   Ht 5\' 2"  (1.575 m)   Wt 163 lb 12.8 oz (74.3 kg)   LMP 11/20/2022   SpO2 100%   BMI 29.96 kg/m   Physical Exam Vitals and nursing note reviewed. Exam conducted with a chaperone present Cleatrice Burke).      ASSESSMENT/PLAN:   No problem-specific Assessment & Plan notes found for this encounter.    Annual Examination  See AVS for age appropriate recommendations.   PHQ score  Flowsheet Row Office Visit from 12/16/2022 in [facility]  PHQ-9 Total Score 0      , reviewed and discussed. Blood pressure reviewed and at goal yes.  The patient currently uses nothing for contraception.   Considered the following items based upon USPSTF recommendations: HIV testing: discussed, up to date Hepatitis C: discussed up to date Hepatitis B: discussed Syphilis if at high risk: discussed and  declined GC/CT 24 or  younger, ordered. Lipid panel (nonfasting or fasting) discussed based upon AHA recommendations and not ordered.  Consider repeat every 4-6 years.  Reviewed risk factors for latent tuberculosis and not indicated  Discussed family history, BRCA testing not indicated. Tool used to risk stratify was Pedigree Assessment tool N/A  Cervical cancer screening: prior Pap reviewed, repeat due in today Immunizations Declined influenza and COVID-19 vaccination   Follow up in 1  year or sooner if indicated.   Other problems addressed: Bloating: Differentials include specific diet intolerance vs SIBO Trial of gas x for bloating I discussed Miralax prn constipation If there is no improvement, will consider a GI referral to assess for SIBO She agreed with the plan  Weight management: BMI in the overweight range She is doing well with diet and exercise Dietitian referral discussed, but deferring for now per her request Continue lifestyle modification and monitor closely  Menstrual cramps: Naproxen escribed prn menstrual pain Declined OCP for now Not actively sexually active   Janit Pagan, MD [facility]

## 2022-12-16 NOTE — Assessment & Plan Note (Signed)
Differentials include specific diet intolerance vs SIBO Trial of gas x for bloating I discussed Miralax prn constipation If there is no improvement, will consider a GI referral to assess for SIBO She agreed with the plan

## 2022-12-21 ENCOUNTER — Telehealth: Payer: Self-pay | Admitting: Family Medicine

## 2022-12-21 ENCOUNTER — Encounter: Payer: Self-pay | Admitting: Family Medicine

## 2022-12-21 LAB — CYTOLOGY - PAP
Chlamydia: POSITIVE — AB
Comment: NEGATIVE
Comment: NEGATIVE
Comment: NORMAL
Diagnosis: NEGATIVE
Neisseria Gonorrhea: NEGATIVE
Trichomonas: NEGATIVE

## 2022-12-21 MED ORDER — DOXYCYCLINE HYCLATE 100 MG PO TABS: 100.0000 mg | ORAL_TABLET | Freq: Two times a day (BID) | ORAL | 0 refills | Status: AC

## 2022-12-21 NOTE — Telephone Encounter (Signed)
STD test result discussed +Chlamydia Treatment plan discussed I advised her to inform her partner to be tested and treated F/U in 3 months for [facility]  Last sexual activity was in May of 2024 - not been active since then and chance of pregnancy is nil.

## 2022-12-22 ENCOUNTER — Telehealth: Payer: Self-pay

## 2022-12-22 NOTE — Telephone Encounter (Signed)
-----   Message from [facility] Molly Maduro B sent at 12/22/2022  9:57 AM EDT ----- Regarding: STI reporting Pos chlamydia

## 2022-12-22 NOTE — Telephone Encounter (Signed)
STD report form completed and faxed to [facility] Department at [phone] (STD department).      Aquilla Solian, CMA

## 2023-03-15 NOTE — Progress Notes (Deleted)
    SUBJECTIVE:   CHIEF COMPLAINT / HPI:   Vaginal Discharge: Patient is a 27 y.o. female presenting with vaginal discharge for *** days.  She states the discharge is of *** consistency.  She endorses *** vaginal odor.  She is not *** interested in screening for sexually transmitted infections today. She has contraception with *** {Contraceptives:21111124}. She {DOES NOT does:27190::"does not"} use barrier method consistently.  PERTINENT  PMH / PSH: ***None relevant  OBJECTIVE:   There were no vitals taken for this visit.   General: NAD, pleasant, able to participate in exam Respiratory: Normal effort, no obvious respiratory distress Pelvic: VULVA: normal appearing vulva with no masses, tenderness or lesions, VAGINA: Normal appearing vagina with normal color, no lesions, with {GYN VAGINAL DISCHARGE:21986} discharge present, ***CERVIX: No lesions, {GYN VAGINAL DISCHARGE:21986} discharge present  Chaperone *** CMA present for pelvic exam  ASSESSMENT/PLAN:   No problem-specific Assessment & Plan notes found for this encounter.   Assessment:  27 y.o. female with vaginal discharge for***days, as well as***.  Physical exam significant for*** discharge.  Wet prep performed today shows *** consistent with ***.  Patient is not interested in STI screening.   Plan: -Wet prep as above.  Will treat with***. -Discussed protection during intercourse and contraceptive methods*** -Follow-up as needed  Levin Erp, MD [facility]

## 2023-03-17 ENCOUNTER — Ambulatory Visit: Payer: No Typology Code available for payment source | Admitting: Student

## 2023-03-30 ENCOUNTER — Ambulatory Visit (INDEPENDENT_AMBULATORY_CARE_PROVIDER_SITE_OTHER): Payer: No Typology Code available for payment source | Admitting: Family Medicine

## 2023-03-30 ENCOUNTER — Encounter: Payer: Self-pay | Admitting: Family Medicine

## 2023-03-30 ENCOUNTER — Other Ambulatory Visit (HOSPITAL_COMMUNITY)
Admission: RE | Admit: 2023-03-30 | Discharge: 2023-03-30 | Disposition: A | Discharge status: Home / Self Care | Payer: No Typology Code available for payment source | Source: Ambulatory Visit | Attending: Family Medicine | Admitting: Family Medicine

## 2023-03-30 VITALS — BP 115/78 | HR 91 | Ht 62.0 in | Wt 159.2 lb

## 2023-03-30 DIAGNOSIS — Z113 Encounter for screening for infections with a predominantly sexual mode of transmission: Secondary | ICD-10-CM | POA: Diagnosis not present

## 2023-03-30 DIAGNOSIS — N898 Other specified noninflammatory disorders of vagina: Secondary | ICD-10-CM | POA: Insufficient documentation

## 2023-03-30 DIAGNOSIS — Z8619 Personal history of other infectious and parasitic diseases: Secondary | ICD-10-CM

## 2023-03-30 NOTE — Progress Notes (Signed)
    SUBJECTIVE:   CHIEF COMPLAINT / HPI:   HPI: STD screen: She is here for TOC for Chlamydia. She was sexually active a few months after her STD infection but had used condoms for protection. LMP was on 12/21/ 24. She is not on birth control and is not interested in getting on one as she is no longer sexually active. She gave hx of vaginal irritation following her menstrual period but denies actual vaginal discharge. She wants to get HIV and RPR test done as well.    PERTINENT  PMH / PSH: PMHx reviewed  OBJECTIVE:   BP 115/78   Pulse 91   Ht 5' 2 (1.575 m)   Wt 159 lb 3.2 oz (72.2 kg)   LMP 03/18/2023   SpO2 100%   BMI 29.12 kg/m   Physical Exam Vitals and nursing note reviewed. Exam conducted with a chaperone present Lossie Meissner).  Cardiovascular:     Rate and Rhythm: Normal rate and regular rhythm.     Heart sounds: Normal heart sounds. No murmur heard. Pulmonary:     Effort: Pulmonary effort is normal. No respiratory distress.     Breath sounds: Normal breath sounds. No wheezing.  Abdominal:     General: Abdomen is flat. Bowel sounds are normal. There is no distension.     Palpations: Abdomen is soft. There is no mass.     Tenderness: There is no abdominal tenderness.  Genitourinary:    Exam position: Lithotomy position.     Labia:        Right: No rash, tenderness or lesion.        Left: No rash, tenderness or lesion.      Vagina: Vaginal discharge present. No tenderness or lesions.     Cervix: Discharge present. No cervical motion tenderness.     Uterus: Normal.      Adnexa: Right adnexa normal and left adnexa normal.     Comments: + thick cottage cheese-like discharge attached to her cervix - I was able to remove with the faux swab. Similar discharge in her vaginal vault.     ASSESSMENT/PLAN:   History of chlamydia infection Here for test of care S/P treatment completion Ancillary testing for GC/Chlam and trich collected HIV, RPR, BV and yeast also  screened I will contact her with test results soon Contraceptive management discussed She is not interested in contraception at this time Continue condom use as needed   Flu and COVID shots offered:She declined shots  Otto Fairly, MD [facility]

## 2023-03-30 NOTE — Assessment & Plan Note (Addendum)
 Here for test of care S/P treatment completion Ancillary testing for GC/Chlam and trich collected HIV, RPR, BV and yeast also screened I will contact her with test results soon Contraceptive management discussed She is not interested in contraception at this time Continue condom use as needed

## 2023-03-30 NOTE — Patient Instructions (Signed)
Safe Sex Practicing safe sex means taking steps before and during sex to reduce your risk of: Getting an STI (sexually transmitted infection). Giving your partner an STI. Unwanted or unplanned pregnancy. How to practice safe sex Ways you can practice safe sex  Limit your sexual partners to only one partner who is having sex with only you. Avoid using alcohol and drugs before having sex. Alcohol and drugs can affect your judgment. Before having sex with a new partner: Talk to your partner about past partners, past STIs, and drug use. Get screened for STIs and discuss the results with your partner. Ask your partner to get screened too. Check your body regularly for sores, blisters, rashes, or unusual discharge. If you notice any of these problems, visit your health care provider. Avoid sexual contact if you have symptoms of an infection or you are being treated for an STI. While having sex, use a condom. Make sure to: Use a condom every time you have vaginal, oral, or anal sex. Both females and males should wear condoms during oral sex. Keep condoms in place from the beginning to the end of sexual activity. Use a latex condom, if possible. Latex condoms offer the best protection. Use only water-based lubricants with a condom. Using petroleum-based lubricants or oils will weaken the condom and increase the chance that it will break. Ways your health care provider can help you practice safe sex  See your health care provider for regular screenings, exams, and tests for STIs. Talk with your health care provider about what kind of birth control (contraception) is best for you. Get vaccinated against hepatitis B and human papillomavirus (HPV). If you are at risk of being infected with HIV (human immunodeficiency virus), talk with your health care provider about taking a prescription medicine to prevent HIV infection. You are at risk for HIV if you: Are a man who has sex with other men. Are  sexually active with more than one partner. Take drugs by injection. Have a sex partner who has HIV. Have unprotected sex. Have sex with someone who has sex with both men and women. Have had an STI. Follow these instructions at home: Take over-the-counter and prescription medicines only as told by your health care provider. Keep all follow-up visits. This is important. Where to find more information Centers for Disease Control and Prevention: www.cdc.gov Planned Parenthood: www.plannedparenthood.org Office on Women's Health: www.womenshealth.gov Summary Practicing safe sex means taking steps before and during sex to reduce your risk getting an STI, giving your partner an STI, and having an unwanted or unplanned pregnancy. Before having sex with a new partner, talk to your partner about past partners, past STIs, and drug use. Use a condom every time you have vaginal, oral, or anal sex. Both females and males should wear condoms during oral sex. Check your body regularly for sores, blisters, rashes, or unusual discharge. If you notice any of these problems, visit your health care provider. See your health care provider for regular screenings, exams, and tests for STIs. This information is not intended to replace advice given to you by your health care provider. Make sure you discuss any questions you have with your health care provider. Document Revised: 08/19/2019 Document Reviewed: 08/19/2019 Elsevier Patient Education  2024 Elsevier Inc.  

## 2023-03-31 ENCOUNTER — Encounter: Payer: Self-pay | Admitting: Family Medicine

## 2023-03-31 LAB — RPR: RPR Ser Ql: NONREACTIVE

## 2023-03-31 LAB — HIV ANTIBODY (ROUTINE TESTING W REFLEX): HIV Screen 4th Generation wRfx: NONREACTIVE

## 2023-04-03 ENCOUNTER — Telehealth: Payer: Self-pay | Admitting: Family Medicine

## 2023-04-03 LAB — CERVICOVAGINAL ANCILLARY ONLY
Bacterial Vaginitis (gardnerella): NEGATIVE
Candida Glabrata: NEGATIVE
Candida Vaginitis: POSITIVE — AB
Chlamydia: NEGATIVE
Comment: NEGATIVE
Comment: NEGATIVE
Comment: NEGATIVE
Comment: NEGATIVE
Comment: NEGATIVE
Comment: NORMAL
Neisseria Gonorrhea: NEGATIVE
Trichomonas: NEGATIVE

## 2023-04-03 MED ORDER — FLUCONAZOLE 150 MG PO TABS: 150.0000 mg | ORAL_TABLET | Freq: Once | ORAL | 0 refills | Status: AC

## 2023-04-03 NOTE — Telephone Encounter (Signed)
 Negative GC/Chlamydia and Trich discussed. She has positive candida infection. Diflucan escribed. I advised her to follow up soon if she has recurrence of her yeast infection for further testing. She agreed with the plan.

## 2023-06-14 ENCOUNTER — Ambulatory Visit

## 2023-06-30 ENCOUNTER — Encounter: Payer: Self-pay | Admitting: Family Medicine

## 2023-06-30 ENCOUNTER — Encounter

## 2023-06-30 MED ORDER — FLUCONAZOLE 150 MG PO TABS: 150.0000 mg | ORAL_TABLET | Freq: Once | ORAL | 0 refills | Status: AC

## 2023-07-18 ENCOUNTER — Ambulatory Visit: Admitting: Family Medicine

## 2023-09-18 ENCOUNTER — Encounter: Payer: Self-pay | Admitting: Family Medicine

## 2023-09-19 ENCOUNTER — Ambulatory Visit: Admitting: Family Medicine

## 2023-09-19 NOTE — Telephone Encounter (Signed)
 Spoke with patient made appt for 6/27 at 11:30. Nelson Land, CMA

## 2023-09-22 ENCOUNTER — Ambulatory Visit (INDEPENDENT_AMBULATORY_CARE_PROVIDER_SITE_OTHER): Admitting: Family Medicine

## 2023-09-22 ENCOUNTER — Encounter: Payer: Self-pay | Admitting: Family Medicine

## 2023-09-22 VITALS — BP 114/65 | HR 65 | Ht 62.0 in | Wt 176.0 lb

## 2023-09-22 DIAGNOSIS — K5900 Constipation, unspecified: Secondary | ICD-10-CM | POA: Diagnosis not present

## 2023-09-22 DIAGNOSIS — N898 Other specified noninflammatory disorders of vagina: Secondary | ICD-10-CM

## 2023-09-22 DIAGNOSIS — R140 Abdominal distension (gaseous): Secondary | ICD-10-CM | POA: Diagnosis not present

## 2023-09-22 LAB — POCT WET PREP (WET MOUNT)
Clue Cells Wet Prep Whiff POC: NEGATIVE
Trichomonas Wet Prep HPF POC: ABSENT

## 2023-09-22 MED ORDER — FLUCONAZOLE 150 MG PO TABS: 150.0000 mg | ORAL_TABLET | Freq: Once | ORAL | 0 refills | Status: AC

## 2023-09-22 NOTE — Assessment & Plan Note (Signed)
 Gas- X recommended Continue good hydration Monitor for improvement

## 2023-09-22 NOTE — Patient Instructions (Signed)
 Irritation of the Vagina (Vaginitis): What to Know  Vaginitis is irritation and swelling of the vagina. It happens when the usual balance of bacteria and yeast in the vagina changes. This change causes some types to grow too much. This overgrowth leads to vaginitis. What are the causes? Bacteria. Yeast, which is a fungus. A parasite. A virus. Low hormones in the body. This can occur during pregnancy, breastfeeding, or after menopause. What increases the risk? Irritants, such as douches, bubble baths, scented tampons, and feminine sprays. Antibiotics. Poor hygiene. Wearing tight pants or thong underwear. Some birth control methods, such as diaphragms, vaginal sponges, or spermicides. Having sex without a condom or having sex with more than one person. Infections. Uncontrolled diabetes. What are the signs or symptoms? Abnormal fluid from the vagina. The fluid may be: White, gray, or yellow. Thick, white, and cheesy. Frothy and yellow or green. A bad smell from the vagina. Itching, pain, or swelling in the vagina. Pain during sex. Pain or burning when you pee. How is this diagnosed? This condition is diagnosed based on your symptoms, medical history, and an exam. This may include a pelvic exam. Tests may also be done. Tests may be done to: Check the pH level of your vagina. Check the fluid in your vagina. How is this treated? Treatment will depend on what is causing your vaginitis. Treatment may include: Antibiotics. Antifungal medicines. Medicines to treat symptoms if you have a virus. Your sex partner should also be treated. Estrogen medicines. Medicines to treat allergies. The medicines may be pills or creams. Follow these instructions at home: Lifestyle Keep the area around your vagina clean and dry. Avoid using soap. Rinse the area with water. Until your health care provider says it's okay: Do not douche. Do not use tampons. Use pads, if needed. Do not have sex. Wipe  from front to back after going to the bathroom. When the provider says it's okay, practice safe sex. Use condoms. General instructions Take your medicines only as told. If you were given antibiotics, take them as told. Do not stop taking them even if you start to feel better. How is this prevented? Use mild, unscented products. Avoid the following products if they are scented: Sprays. Detergents. Tampons. Products for cleaning the vagina. Soaps or bubble baths. Let air reach your genital area. To do this: Wear cotton underwear. Do not wear underwear while you sleep. Do not wear tight pants and underwear or pantyhose without a cotton panel. Do not wear thong underwear. Take off any wet clothing, such as bathing suits, as soon as possible. Practice safe sex. Use condoms. Contact a health care provider if: You have pain in the belly or around the pelvis. You have a fever or chills. You have symptoms that last for more than 2-3 days. This information is not intended to replace advice given to you by your health care provider. Make sure you discuss any questions you have with your health care provider. Document Revised: 12/15/2022 Document Reviewed: 07/25/2022 Elsevier Patient Education  2024 [employer].

## 2023-09-22 NOTE — Progress Notes (Addendum)
    SUBJECTIVE:   CHIEF COMPLAINT / HPI:   Vaginal Discharge The patient's primary symptoms include vaginal discharge. This is a new problem. Episode onset: Started having vaginal discharge x 3 days. The problem occurs constantly. The problem has been gradually improving (She was itchy before, however, she does not itch anymore). The pain is moderate. Pregnant now: LMP 09/06/24. Associated symptoms include constipation. Pertinent negatives include no abdominal pain, anorexia, diarrhea, dysuria, frequency, hematuria, nausea or urgency. The vaginal discharge was green (Green color improving). Nothing aggravates the symptoms. She has tried nothing for the symptoms. The treatment provided no relief. She is not sexually active. No, her partner does not have an STD. She uses nothing for contraception. Her menstrual history has been regular.  Constipation This is a chronic problem. Episode onset: Few  months ago. The problem has been waxing and waning since onset. Her stool frequency is 2 to 3 times per week. The stool is described as formed (Soft, non-bloody stool). Associated symptoms include bloating. Pertinent negatives include no abdominal pain, anorexia, diarrhea, fecal incontinence, hematochezia, melena or nausea. Associated symptoms comments: She feels bloated all the time and feels like gas trapping in her stomach, hence, making her gain weight. Her stool is soft with BM Q 2-3 days. She denies abdominal pain, but cramps with bloating. . Treatments tried: Taking OTC digestive supplement. The treatment provided no relief.    PERTINENT  PMH / PSH: PMHx reviewed  OBJECTIVE:   BP 114/65   Pulse 65   Ht 5' 2 (1.575 m)   Wt 176 lb (79.8 kg)   LMP 09/09/2023   SpO2 100%   BMI 32.19 kg/m   Physical Exam Vitals and nursing note reviewed. Exam conducted with a chaperone present Lossie Meissner).   Cardiovascular:     Rate and Rhythm: Normal rate and regular rhythm.     Heart sounds: Normal heart  sounds. No murmur heard. Pulmonary:     Effort: Pulmonary effort is normal. No respiratory distress.     Breath sounds: Normal breath sounds. No wheezing.  Genitourinary:    Comments: Scanty, slightly yellowish green discharge  Neurological:     Mental Status: She is alert.      ASSESSMENT/PLAN:   Assessment & Plan Vaginal discharge Wet prep completed She declined STD test Declined contraceptives - no risk for STD infection I will call with test results and manage appropriately Constipation, unspecified constipation type As discussed with her, she does not have true constipation Likely more issues with bloating than not Gas-X recommended F/U as needed She agreed with the plan  Bloating Gas- X recommended Continue good hydration Monitor for improvement   Post-visit telephone call Wet prep report discussed + Yeast Diflucan  escribed As discussed with her, if she gets another episode, we will need to screen her for DM as well as consider prophylactic treatment She agreed with the plan  Otto Fairly, MD [facility]

## 2023-09-22 NOTE — Addendum Note (Signed)
 Addended by: ANDERS CUMINS T on: 09/22/2023 12:07 PM   Modules accepted: Orders

## 2023-12-08 ENCOUNTER — Ambulatory Visit (INDEPENDENT_AMBULATORY_CARE_PROVIDER_SITE_OTHER)

## 2023-12-08 DIAGNOSIS — Z111 Encounter for screening for respiratory tuberculosis: Secondary | ICD-10-CM

## 2023-12-08 NOTE — Progress Notes (Signed)
 Patients presents to nurse clinic for PPD placement.  PPD placed in left ventral forearm without complication.  Patient to return on 9/15 to have site read.

## 2023-12-11 ENCOUNTER — Ambulatory Visit (INDEPENDENT_AMBULATORY_CARE_PROVIDER_SITE_OTHER)

## 2023-12-11 DIAGNOSIS — Z111 Encounter for screening for respiratory tuberculosis: Secondary | ICD-10-CM

## 2023-12-11 LAB — TB SKIN TEST
Induration: 0 mm
TB Skin Test: NEGATIVE

## 2023-12-11 NOTE — Progress Notes (Signed)
PPD Reading Note PPD read and results entered in [employer]. Result: 0 mm induration. Interpretation: Negative Allergic reaction: No  

## 2023-12-12 ENCOUNTER — Telehealth: Payer: Self-pay

## 2023-12-12 NOTE — Telephone Encounter (Signed)
 Patient presents to [facility] for form completion for her employer.   TB screening completed on 12/11/2023.  Patient is UTD with required immunizations. I have printed and attached to form.   Form placed in PCP box for review.

## 2023-12-15 ENCOUNTER — Encounter: Payer: Self-pay | Admitting: Family Medicine

## 2023-12-15 ENCOUNTER — Ambulatory Visit (INDEPENDENT_AMBULATORY_CARE_PROVIDER_SITE_OTHER): Admitting: Family Medicine

## 2023-12-15 VITALS — BP 102/65 | HR 62 | Ht 62.0 in | Wt 167.0 lb

## 2023-12-15 DIAGNOSIS — F320 Major depressive disorder, single episode, mild: Secondary | ICD-10-CM | POA: Diagnosis not present

## 2023-12-15 DIAGNOSIS — Z0000 Encounter for general adult medical examination without abnormal findings: Secondary | ICD-10-CM | POA: Diagnosis not present

## 2023-12-15 NOTE — Patient Instructions (Signed)
 Managing Depression, Adult Depression is a mental health condition that affects your thoughts, feelings, and actions. Being diagnosed with depression can bring you relief if you did not know why you have felt or behaved a certain way. It could also leave you feeling overwhelmed. Finding ways to manage your symptoms can help you feel more positive about your future. How to manage lifestyle changes Being depressed is difficult. Depression can increase the level of everyday stress. Stress can make depression symptoms worse. You may believe your symptoms cannot be managed or will never improve. However, there are many things you can try to help manage your symptoms. There is hope. Managing stress  Stress is your body's reaction to life changes and events, both good and bad. Stress can add to your feelings of depression. Learning to manage your stress can help lessen your feelings of depression. Try some of the following approaches to reducing your stress (stress reduction techniques): Listen to music that you enjoy and that inspires you. Try using a meditation app or take a meditation class. Develop a practice that helps you connect with your spiritual self. Walk in nature, pray, or go to a place of worship. Practice deep breathing. To do this, inhale slowly through your nose. Pause at the top of your inhale for a few seconds and then exhale slowly, letting yourself relax. Repeat this three or four times. Practice yoga to help relax and work your muscles. Choose a stress reduction technique that works for you. These techniques take time and practice to develop. Set aside 5-15 minutes a day to do them. Therapists can offer training in these techniques. Do these things to help manage stress: Keep a journal. Know your limits. Set healthy boundaries for yourself and others, such as saying "no" when you think something is too much. Pay attention to how you react to certain situations. You may not be able to  control everything, but you can change your reaction. Add humor to your life by watching funny movies or shows. Make time for activities that you enjoy and that relax you. Spend less time using electronics, especially at night before bed. The light from screens can make your brain think it is time to get up rather than go to bed.  Medicines Medicines, such as antidepressants, are often a part of treatment for depression. Talk with your pharmacist or health care provider about all the medicines, supplements, and herbal products that you take, their possible side effects, and what medicines and other products are safe to take together. Make sure to report any side effects you may have to your health care provider. Relationships Your health care provider may suggest family therapy, couples therapy, or individual therapy as part of your treatment. How to recognize changes Everyone responds differently to treatment for depression. As you recover from depression, you may start to: Have more interest in doing activities. Feel more hopeful. Have more energy. Eat a more regular amount of food. Have better mental focus. It is important to recognize if your depression is not getting better or is getting worse. The symptoms you had in the beginning may return, such as: Feeling tired. Eating too much or too little. Sleeping too much or too little. Feeling restless, agitated, or hopeless. Trouble focusing or making decisions. Having unexplained aches and pains. Feeling irritable, angry, or aggressive. If you or your family members notice these symptoms coming back, let your health care provider know right away. Follow these instructions at home: Activity Try to  get some form of exercise each day, such as walking. Try yoga, mindfulness, or other stress reduction techniques. Participate in group activities if you are able. Lifestyle Get enough sleep. Cut down on or stop using caffeine, tobacco,  alcohol, and any other harmful substances. Eat a healthy diet that includes plenty of vegetables, fruits, whole grains, low-fat dairy products, and lean protein. Limit foods that are high in solid fats, added sugar, or salt (sodium). General instructions Take over-the-counter and prescription medicines only as told by your health care provider. Keep all follow-up visits. It is important for your health care provider to check on your mood, behavior, and medicines. Your health care provider may need to make changes to your treatment. Where to find support Talking to others  Friends and family members can be sources of support and guidance. Talk to trusted friends or family members about your condition. Explain your symptoms and let them know that you are working with a health care provider to treat your depression. Tell friends and family how they can help. Finances Find mental health providers that fit with your financial situation. Talk with your health care provider if you are worried about access to food, housing, or medicine. Call your insurance company to learn about your co-pays and prescription plan. Where to find more information You can find support in your area from: Anxiety and Depression Association of America (ADAA): adaa.org Mental Health [place]: mentalhealthamerica.net [employer] on Mental Illness: nami.org Contact a health care provider if: You stop taking your antidepressant medicines, and you have any of these symptoms: Nausea. Headache. Light-headedness. Chills and body aches. Not being able to sleep (insomnia). You or your friends and family think your depression is getting worse. Get help right away if: You have thoughts of hurting yourself or others. Get help right away if you feel like you may hurt yourself or others, or have thoughts about taking your own life. Go to your nearest emergency room or: Call 911. Call the National Suicide Prevention Lifeline at  (215) 435-0408 or 988. This is open 24 hours a day. Text the Crisis Text Line at 318 581 7774. This information is not intended to replace advice given to you by your health care provider. Make sure you discuss any questions you have with your health care provider. Document Revised: 07/20/2021 Document Reviewed: 07/20/2021 Elsevier Patient Education  2024 [employer].

## 2023-12-15 NOTE — Progress Notes (Signed)
 SUBJECTIVE:   Chief compliant/HPI: annual examination  Kiara Mcfarland is a 28 y.o. who presents today for an annual exam.  Endorsed some depressed mood managed with no medication. She stated she is coping well and does not want referral for counseling.   Review of systems form notable for depressed mood-intermittenly.   Updated history tabs and problem list : Yes.   LMP: 12/13/23  OBJECTIVE:   BP 102/65   Pulse 62   Ht 5' 2 (1.575 m)   Wt 167 lb (75.8 kg)   LMP 12/13/2023   SpO2 99%   BMI 30.54 kg/m   Physical Exam Vitals reviewed.  Constitutional:      Appearance: Normal appearance.  HENT:     Right Ear: Tympanic membrane and ear canal normal. There is no impacted cerumen.     Left Ear: Tympanic membrane and ear canal normal. There is no impacted cerumen.     Mouth/Throat:     Mouth: Mucous membranes are moist.     Pharynx: No oropharyngeal exudate or posterior oropharyngeal erythema.  Eyes:     Conjunctiva/sclera: Conjunctivae normal.  Cardiovascular:     Rate and Rhythm: Normal rate and regular rhythm.     Heart sounds: Normal heart sounds. No murmur heard. Pulmonary:     Effort: Pulmonary effort is normal. No respiratory distress.     Breath sounds: Normal breath sounds. No wheezing.  Abdominal:     General: Abdomen is flat. Bowel sounds are normal. There is no distension.     Palpations: Abdomen is soft. There is no mass.     Tenderness: There is no abdominal tenderness.  Musculoskeletal:     Right lower leg: No edema.     Left lower leg: No edema.  Skin:    Findings: No lesion.  Neurological:     General: No focal deficit present.     Mental Status: She is oriented to person, place, and time.     Cranial Nerves: No cranial nerve deficit.     Sensory: No sensory deficit.  Psychiatric:        Mood and Affect: Mood normal.        Behavior: Behavior normal.     Comments: No SI or HI      ASSESSMENT/PLAN:   Assessment & Plan  Annual  Examination  See AVS for age appropriate recommendations.   PHQ score 9, reviewed and discussed. Blood pressure reviewed and at goal : Yes.  Asked about intimate partner violence and patient reports none.  The patient currently uses nothing for contraception. Advanced directives Discussed.   Considered the following items based upon USPSTF recommendations: HIV testing: discussed and UTD Hepatitis C: discussed and UTD Hepatitis B: discussed Syphilis if at high risk: discussed GC/CT not at high risk and not ordered. Lipid panel (nonfasting or fasting) discussed based upon AHA recommendations and not ordered.  Consider repeat every 4-6 years.  Reviewed risk factors for latent tuberculosis and not indicated  Discussed family history, BRCA testing not indicated. Tool used to risk stratify was Pedigree Assessment tool N/A  Cervical cancer screening: UTD Immunizations declined COVID and Flu  MyChart Activation:Already signed up   Follow up in 1   year or sooner if indicated.  Offered pharmacotherapy and CBT referral for depression. She declined both. Will reach out for help if needed. Brief counseling provided via AVS.  Work physical form completed and was handed over to her directly. See media for the copy.  Otto Fairly, MD [facility]

## 2023-12-15 NOTE — Assessment & Plan Note (Signed)
 Offered pharmacotherapy and CBT referral for depression. She declined both. Will reach out for help if needed. Brief counseling provided via AVS.

## 2024-01-16 ENCOUNTER — Other Ambulatory Visit: Payer: Self-pay | Admitting: Family Medicine

## 2024-04-04 ENCOUNTER — Encounter: Payer: Self-pay | Admitting: Family Medicine

## 2024-04-05 ENCOUNTER — Other Ambulatory Visit: Payer: Self-pay | Admitting: Family Medicine

## 2024-04-05 ENCOUNTER — Ambulatory Visit: Admitting: Family Medicine

## 2024-04-05 MED ORDER — FLUCONAZOLE 100 MG PO TABS: 100.0000 mg | ORAL_TABLET | Freq: Once | ORAL | 0 refills | Status: AC
